# Patient Record
Sex: Female | Born: 1935 | Race: Black or African American | Hispanic: No | Smoking: Never smoker
Health system: Southern US, Community
[De-identification: ages and names within clinical notes are randomized; demographics above are authoritative.]

## PROBLEM LIST (undated history)

## (undated) DIAGNOSIS — Z950 Presence of cardiac pacemaker: Secondary | ICD-10-CM

## (undated) DIAGNOSIS — I679 Cerebrovascular disease, unspecified: Secondary | ICD-10-CM

## (undated) DIAGNOSIS — K219 Gastro-esophageal reflux disease without esophagitis: Secondary | ICD-10-CM

## (undated) DIAGNOSIS — M159 Polyosteoarthritis, unspecified: Secondary | ICD-10-CM

## (undated) DIAGNOSIS — F0150 Vascular dementia without behavioral disturbance: Secondary | ICD-10-CM

## (undated) DIAGNOSIS — I10 Essential (primary) hypertension: Secondary | ICD-10-CM

## (undated) DIAGNOSIS — E1149 Type 2 diabetes mellitus with other diabetic neurological complication: Secondary | ICD-10-CM

## (undated) HISTORY — DX: Vascular dementia, unspecified severity, without behavioral disturbance, psychotic disturbance, mood disturbance, and anxiety: F01.50

## (undated) HISTORY — DX: Type 2 diabetes mellitus with other diabetic neurological complication: E11.49

---

## 2011-04-26 ENCOUNTER — Emergency Department (HOSPITAL_COMMUNITY)
Admission: EM | Admit: 2011-04-26 | Discharge: 2011-04-26 | Disposition: A | Discharge status: Skilled Nursing Facility | Payer: Medicare Other | Attending: Emergency Medicine | Admitting: Emergency Medicine

## 2011-04-26 ENCOUNTER — Emergency Department (HOSPITAL_COMMUNITY): Payer: Medicare Other

## 2011-04-26 ENCOUNTER — Encounter (HOSPITAL_COMMUNITY): Payer: Self-pay | Admitting: Emergency Medicine

## 2011-04-26 ENCOUNTER — Encounter (HOSPITAL_COMMUNITY): Payer: Self-pay | Admitting: *Deleted

## 2011-04-26 DIAGNOSIS — R4701 Aphasia: Secondary | ICD-10-CM | POA: Insufficient documentation

## 2011-04-26 DIAGNOSIS — M1990 Unspecified osteoarthritis, unspecified site: Secondary | ICD-10-CM | POA: Insufficient documentation

## 2011-04-26 DIAGNOSIS — Z7982 Long term (current) use of aspirin: Secondary | ICD-10-CM | POA: Insufficient documentation

## 2011-04-26 DIAGNOSIS — Z79899 Other long term (current) drug therapy: Secondary | ICD-10-CM | POA: Insufficient documentation

## 2011-04-26 DIAGNOSIS — Z950 Presence of cardiac pacemaker: Secondary | ICD-10-CM | POA: Insufficient documentation

## 2011-04-26 DIAGNOSIS — E119 Type 2 diabetes mellitus without complications: Secondary | ICD-10-CM | POA: Insufficient documentation

## 2011-04-26 DIAGNOSIS — F0280 Dementia in other diseases classified elsewhere without behavioral disturbance: Secondary | ICD-10-CM | POA: Insufficient documentation

## 2011-04-26 DIAGNOSIS — Z794 Long term (current) use of insulin: Secondary | ICD-10-CM | POA: Insufficient documentation

## 2011-04-26 DIAGNOSIS — K922 Gastrointestinal hemorrhage, unspecified: Secondary | ICD-10-CM

## 2011-04-26 DIAGNOSIS — Z8673 Personal history of transient ischemic attack (TIA), and cerebral infarction without residual deficits: Secondary | ICD-10-CM | POA: Insufficient documentation

## 2011-04-26 DIAGNOSIS — K920 Hematemesis: Secondary | ICD-10-CM | POA: Insufficient documentation

## 2011-04-26 DIAGNOSIS — I10 Essential (primary) hypertension: Secondary | ICD-10-CM | POA: Insufficient documentation

## 2011-04-26 DIAGNOSIS — K219 Gastro-esophageal reflux disease without esophagitis: Secondary | ICD-10-CM | POA: Insufficient documentation

## 2011-04-26 DIAGNOSIS — G309 Alzheimer's disease, unspecified: Secondary | ICD-10-CM | POA: Insufficient documentation

## 2011-04-26 HISTORY — DX: Essential (primary) hypertension: I10

## 2011-04-26 HISTORY — DX: Presence of cardiac pacemaker: Z95.0

## 2011-04-26 HISTORY — DX: Cerebrovascular disease, unspecified: I67.9

## 2011-04-26 HISTORY — DX: Gastro-esophageal reflux disease without esophagitis: K21.9

## 2011-04-26 HISTORY — DX: Polyosteoarthritis, unspecified: M15.9

## 2011-04-26 LAB — CBC
HCT: 38.6 % (ref 36.0–46.0)
HCT: 36.6 % (ref 36.0–46.0)
MCH: 25.2 pg — ABNORMAL LOW (ref 26.0–34.0)
MCH: 26 pg (ref 26.0–34.0)
MCHC: 32.1 g/dL (ref 30.0–36.0)
MCV: 78.5 fL (ref 78.0–100.0)
MCV: 78.5 fL (ref 78.0–100.0)
Platelets: 371 10*3/uL (ref 150–400)
RDW: 16.4 % — ABNORMAL HIGH (ref 11.5–15.5)
RDW: 16.5 % — ABNORMAL HIGH (ref 11.5–15.5)

## 2011-04-26 LAB — DIFFERENTIAL
Eosinophils Absolute: 0.2 10*3/uL (ref 0.0–0.7)
Eosinophils Relative: 2 % (ref 0–5)
Lymphocytes Relative: 29 % (ref 12–46)
Lymphs Abs: 2.8 10*3/uL (ref 0.7–4.0)
Monocytes Absolute: 0.9 10*3/uL (ref 0.1–1.0)

## 2011-04-26 LAB — OCCULT BLOOD, POC DEVICE: Fecal Occult Bld: NEGATIVE

## 2011-04-26 LAB — TYPE AND SCREEN
ABO/RH(D): A POS
Antibody Screen: NEGATIVE

## 2011-04-26 LAB — COMPREHENSIVE METABOLIC PANEL
Albumin: 3.1 g/dL — ABNORMAL LOW (ref 3.5–5.2)
BUN: 15 mg/dL (ref 6–23)
Calcium: 9.1 mg/dL (ref 8.4–10.5)
Creatinine, Ser: 1.11 mg/dL — ABNORMAL HIGH (ref 0.50–1.10)
GFR calc Af Amer: 53 mL/min — ABNORMAL LOW (ref >90)
Glucose, Bld: 256 mg/dL — ABNORMAL HIGH (ref 70–99)
Total Protein: 6.9 g/dL (ref 6.0–8.3)

## 2011-04-26 LAB — PROTIME-INR
INR: 0.99 (ref 0.00–1.49)
Prothrombin Time: 13.3 seconds (ref 11.6–15.2)

## 2011-04-26 MED ORDER — PANTOPRAZOLE SODIUM 40 MG IV SOLR
40.0000 mg | Freq: Once | INTRAVENOUS | Status: AC
  Administered 2011-04-26: 40 mg via INTRAVENOUS
  Filled 2011-04-26: qty 40

## 2011-04-26 MED ORDER — ONDANSETRON HCL 4 MG/2ML IJ SOLN
4.0000 mg | Freq: Once | INTRAMUSCULAR | Status: AC
  Administered 2011-04-26: 4 mg via INTRAVENOUS
  Filled 2011-04-26: qty 2

## 2011-04-26 MED ORDER — SODIUM CHLORIDE 0.9 % IV SOLN
INTRAVENOUS | Status: DC
  Administered 2011-04-26 (×2): 125 mL/h via INTRAVENOUS

## 2011-04-26 MED ORDER — ONDANSETRON HCL 4 MG/2ML IJ SOLN
INTRAMUSCULAR | Status: AC
  Administered 2011-04-26: 4 mg via INTRAVENOUS
  Filled 2011-04-26: qty 2

## 2011-04-26 NOTE — ED Notes (Signed)
Patient from nursing home seen in ED one day ago discharged back to facility today. Staff called EMS for emesis bright red blood X1.  Patient non verbal normal for patient no distress noted by EMS or arrival to ED.  Airway intact bilateral equal chest rise and fall.

## 2011-04-26 NOTE — ED Notes (Signed)
2 family members arrived stated patient and large amount of emesis bright red 0200 today sent to ED for evaluation and was discharged back to the nursing home.  Nurse and nursing home stated sent back to ED for reevaluation to make sure.  No emesis after returning from ED.

## 2011-04-26 NOTE — ED Notes (Signed)
Pt arrived via [facility] c/o upper GI Bleed. Emesis x 2 with coffee ground appearance. From [facility]. 20 ga rt hand. 150 NS, and 4 mg Zofran administered prior to arrival. PT is non verbal.

## 2011-04-26 NOTE — ED Notes (Signed)
Pt arrived via [facility] already in gown; placed on continuous pulse oximetry and blood pressure cuff; warm blankets given

## 2011-04-26 NOTE — ED Provider Notes (Signed)
History     CSN: [id number]  Arrival date & time 04/26/11  1205   First MD Initiated Contact with Patient 04/26/11 1221      Chief Complaint  Patient presents with  . Hematemesis    (Consider location/radiation/quality/duration/timing/severity/associated sxs/prior treatment) Patient is a 78 y.o. female presenting with vomiting. The history is provided by the patient. No language interpreter was used.  Emesis  This is a new problem. The current episode started 12 to 24 hours ago. Episode frequency: 1 time. The problem has not changed since onset.The emesis has an appearance of bright red blood. There has been no fever. Pertinent negatives include no chills, no diarrhea, no fever and no sweats.   patient returning for the second time today from nursing facility with complaint of emesis bright red blood in it times one at 2 AM this morning. Patient was discharged from the ER around 5:30 after a workup for the bloody emesis and was sent home. Family is at the bedside saying that the nurse at the facility said that it was too much blood and she needs to come back to be reevaluated. Patient sitting up in bed alert looking around no distress. She does have Alzheimer's and is not verbally communicating. Family members at bedside very concerned that she's lost too much blood. I told him we will recheck her blood level but she probably needs endoscopy or colonoscopy. They were agreeable to this. .  Past Medical History  Diagnosis Date  . Osteoarthrosis, generalized, involving multiple sites   . Alzheimer disease   . Esophageal reflux   . Diabetes mellitus   . Hypertension   . Pacemaker   . Cerebrovascular disease     History reviewed. No pertinent past surgical history.  No family history on file.  History  Substance Use Topics  . Smoking status: Never Smoker   . Smokeless tobacco: Not on file  . Alcohol Use: No    OB History    Grav Para Term Preterm Abortions TAB SAB Ect Mult  Living                  Review of Systems  Unable to perform ROS Constitutional: Negative for fever and chills.  Gastrointestinal: Positive for vomiting. Negative for diarrhea.    Allergies  Review of patient's allergies indicates no known allergies.  Home Medications   Current Outpatient Rx  Name Route Sig Dispense Refill  . ASPIRIN 81 MG PO TBDP Oral Take 81 mg by mouth daily.    Marland Kitchen GLIPIZIDE 10 MG PO TABS Oral Take 10 mg by mouth 2 (two) times daily before a meal.    . INSULIN ASPART 100 UNIT/ML [place] SOLN Subcutaneous Inject 5 Units into the skin 3 (three) times daily before meals. If cbg >150    . INSULIN GLARGINE 100 UNIT/ML [place] SOLN Subcutaneous Inject 10 Units into the skin at bedtime.    . MELOXICAM 15 MG PO TABS Oral Take 15 mg by mouth daily.    Marland Kitchen MEMANTINE HCL 10 MG PO TABS Oral Take 10 mg by mouth daily.     Marland Kitchen OMEPRAZOLE 20 MG PO CPDR Oral Take 20 mg by mouth daily before breakfast.    . ACETAMINOPHEN 325 MG PO TABS Oral Take 650 mg by mouth every 4 (four) hours as needed. For pain or fever    . PROMETHAZINE HCL 25 MG PO TABS Oral Take 25 mg by mouth every 8 (eight) hours as needed. nausea  BP 131/64  Pulse 75  Temp(Src) 98.6 F (37 C) (Oral)  Resp 15  SpO2 97%  Physical Exam  Nursing note and vitals reviewed. Constitutional: She appears well-developed and well-nourished.  HENT:  Head: Normocephalic.  Eyes: Conjunctivae and EOM are normal. Pupils are equal, round, and reactive to light.  Neck: Normal range of motion. Neck supple.  Cardiovascular: Normal rate.   Pulmonary/Chest: Effort normal.  Abdominal: Soft. Bowel sounds are normal. She exhibits no distension. There is no tenderness. There is no rebound and no guarding.  Musculoskeletal: Normal range of motion.  Neurological: She is alert.       Dementia no verbal communication  Skin: Skin is warm and dry.  Psychiatric:       No verbal alert focusing and following.    ED Course  Procedures  (including critical care time)   Labs Reviewed  CBC  DIFFERENTIAL  URINALYSIS, ROUTINE W REFLEX MICROSCOPIC   Dg Chest Portable 1 View  04/26/2011  *RADIOLOGY REPORT*  Clinical Data: GI bleed.  Shortness of breath.  PORTABLE CHEST - 1 VIEW  Comparison: None.  Findings: Dual lead cardiac pacemaker.  Shallow inspiration. Cardiac enlargement with mild prominence of pulmonary vascularity. Hazy appearance of the hila may be due to soft tissue attenuation versus early perihilar edema.  No apparent blunting of costophrenic angles.  No focal airspace consolidation.  No pneumothorax. Calcification of the aorta.  IMPRESSION: Cardiac enlargement with mild pulmonary vascular congestion and suggestion of early pulmonary edema.  Original Report Authenticated By: Marlon Pel, M.D.     No diagnosis found.    MDM  Here for the second time today for gi bleed.  Vomited x 1 around 2am.  Was seen by dr Dierdre Highman and sent back to nursing facility.  Hgb stable again on CBC today.  12.3 this am and 12.1 presently.  Given a gi follow up and continue omeprazole 20mg .  Call tday for appointment.  - occult stool x 2.  Labs Reviewed  CBC - Abnormal; Notable for the following:    RDW 16.5 (*)    All other components within normal limits  DIFFERENTIAL  OCCULT BLOOD, POC DEVICE          Remi Haggard, NP 04/26/11 1655  Remi Haggard, NP 04/26/11 1655

## 2011-04-26 NOTE — ED Notes (Signed)
Patient arrived to ED by EMS no distress present airway intact bilateral equal chest rise and fall lungs clear in all fields.  Patient non verbal normal per nursing home staff.  Abdomen soft distended bowel sounds present in all fields.

## 2011-04-26 NOTE — ED Provider Notes (Signed)
History     CSN: [id number]  Arrival date & time 04/26/11  4098   First MD Initiated Contact with Patient 04/26/11 0330      Chief Complaint  Patient presents with  . GI Bleeding    (Consider location/radiation/quality/duration/timing/severity/associated sxs/prior treatment) HPI History provided by EMS and nursing home report and personnel. Level V caveat applies for baseline aphasia and unable to provide history. Brought in by EMS tonight for vomiting followed by coffee-ground emesis while at the nursing home. No bloody stools or black or tarry stools noted. No apparent, pain or symptoms otherwise. Per nursing home report has no history of same, is on Prilosec and Mobic, Tylenol and aspirin. No known history of GI bleed or ulcers. Patient is able to shake her head yes and no enhancement limited yes no questions. She denies any pain any denies any nausea after receiving Zofran in route. No past medical history on file.  No past surgical history on file.  No family history on file.  History  Substance Use Topics  . Smoking status: Not on file  . Smokeless tobacco: Not on file  . Alcohol Use: Not on file    OB History    No data available      Review of Systems  Unable to perform ROS  Level V caveat applies as above Allergies  Review of patient's allergies indicates no known allergies.  Home Medications   Current Outpatient Rx  Name Route Sig Dispense Refill  . ACETAMINOPHEN 325 MG PO TABS Oral Take by mouth every 6 (six) hours as needed. For pain or fever    . ASPIRIN 81 MG PO TBDP Oral Take 81 mg by mouth daily.    Marland Kitchen GLIPIZIDE 10 MG PO TABS Oral Take 10 mg by mouth 2 (two) times daily before a meal.    . INSULIN ASPART 100 UNIT/ML [place] SOLN Subcutaneous Inject 5 Units into the skin 3 (three) times daily before meals. If cbg >150    . INSULIN GLARGINE 100 UNIT/ML [place] SOLN Subcutaneous Inject 10 Units into the skin at bedtime.    . MELOXICAM 15 MG PO TABS Oral Take 15 mg  by mouth daily.    Marland Kitchen MEMANTINE HCL 10 MG PO TABS Oral Take 10 mg by mouth 2 (two) times daily.    Marland Kitchen OMEPRAZOLE 20 MG PO CPDR Oral Take 20 mg by mouth daily before breakfast.    . PROMETHAZINE HCL 25 MG PO TABS Oral Take 25 mg by mouth every 8 (eight) hours as needed. nausea      BP 151/60  Temp(Src) 98.1 F (36.7 C) (Oral)  Resp 20  SpO2 98%  Physical Exam  Constitutional: She appears well-developed and well-nourished.  HENT:  Head: Normocephalic and atraumatic.  Eyes: Conjunctivae and EOM are normal. Pupils are equal, round, and reactive to light.  Neck: Trachea normal. Neck supple. No thyromegaly present.  Cardiovascular: Normal rate, regular rhythm, S1 normal, S2 normal and normal pulses.     No systolic murmur is present   No diastolic murmur is present  Pulses:      Radial pulses are 2+ on the right side, and 2+ on the left side.  Pulmonary/Chest: Effort normal and breath sounds normal. She has no wheezes. She has no rhonchi. She has no rales. She exhibits no tenderness.  Abdominal: Soft. Normal appearance and bowel sounds are normal. There is no tenderness. There is no CVA tenderness and negative Murphy's sign.  Genitourinary:  Guaiac negative brown stool. No masses  Musculoskeletal:       BLE:s Calves nontender, no cords or erythema, negative Homans sign  Neurological: She is alert. She has normal strength. No sensory deficit. GCS eye subscore is 4. GCS verbal subscore is 5. GCS motor subscore is 6.       No unilateral deficits. Moves all extremities x4.  Skin: Skin is warm and dry. No rash noted. She is not diaphoretic.  Psychiatric: Her speech is normal.       Cooperative and appropriate    ED Course  Procedures (including critical care time)  Labs Reviewed  CBC - Abnormal; Notable for the following:    WBC 10.8 (*)    MCH 25.2 (*)    RDW 16.4 (*)    All other components within normal limits  COMPREHENSIVE METABOLIC PANEL - Abnormal; Notable for the  following:    Glucose, Bld 256 (*)    Creatinine, Ser 1.11 (*)    Albumin 3.1 (*)    Alkaline Phosphatase 134 (*)    GFR calc non Af Amer 45 (*)    GFR calc Af Amer 53 (*)    All other components within normal limits  TYPE AND SCREEN  PROTIME-INR  OCCULT BLOOD, POC DEVICE  ABO/RH   Dg Chest Portable 1 View  04/26/2011  *RADIOLOGY REPORT*  Clinical Data: GI bleed.  Shortness of breath.  PORTABLE CHEST - 1 VIEW  Comparison: None.  Findings: Dual lead cardiac pacemaker.  Shallow inspiration. Cardiac enlargement with mild prominence of pulmonary vascularity. Hazy appearance of the hila may be due to soft tissue attenuation versus early perihilar edema.  No apparent blunting of costophrenic angles.  No focal airspace consolidation.  No pneumothorax. Calcification of the aorta.  IMPRESSION: Cardiac enlargement with mild pulmonary vascular congestion and suggestion of early pulmonary edema.  Original Report Authenticated By: Marlon Pel, M.D.      MDM   Coffee-ground emesis x2 at nursing facility  Serial evaluations in the ED with no abdominal tenderness. No anemia. Imaging and labs reviewed as above. No fever or clinical infection. No emesis in the ED. Guaiac negative stool. Recheck at 5:15 remains unchanged - plan discharge back to facility continue Phenergan as needed.  GI referral provided for further evaluation.        Sunnie Nielsen, MD 04/26/11 [phone]

## 2011-04-26 NOTE — Discharge Instructions (Signed)
Brenda Santos your blood work is unchanged since you were here this morning. The hemoglobin is stable at 12.1. Vital signs are stable and heart rate is also stable. You may need endoscopy or colonoscopy . We have referred you to a GI specialist to do this procedure we do not do it in the ER. Call today for an appointment. Also note when we checked her bottom Hemoccult negative. No further vomiting of blood in the ER today. Continue prilosec  daily.  Gastrointestinal Bleeding Bleeding in the gastrointestinal tract comes out when you throw up (vomit) or poop. Treatment will depend on how fast the blood is flowing, where it is coming from, and the cause. A small amount of bleeding that stops on its own may not need treatment.  HOME CARE  Do not drink alcohol.   Do not eat things that upset your stomach or give you heartburn.   Rest and limit your activity.   Do not smoke. Smoking may make your problems worse.   Wash your hands or use sanitizer every time you use the bathroom. Some bleeding is caused by germs.   Only take medicine as told by your doctor.  GET HELP RIGHT AWAY IF:   Your throw up looks like coffee grounds or is dark or bright red.   Your poop is black or tarry. You see blood in the toilet.   You feel weak, dizzy, and short of breath.   You breathe fast and have a fast heartbeat.   You have bad stomach pain or cramping.  MAKE SURE YOU:   Understand these instructions.   Will watch your condition.   Will get help right away if you are not doing well or get worse.  Document Released: 09/27/2007 Document Revised: 12/07/2010 Document Reviewed: 11/27/2010 [facility] Patient Information 2012 [place], [place].Gastrointestinal Bleeding Bleeding in the gastrointestinal tract comes out when you throw up (vomit) or poop. Treatment will depend on how fast the blood is flowing, where it is coming from, and the cause. A small amount of bleeding that stops on its own may not need treatment.    HOME CARE  Do not drink alcohol.   Do not eat things that upset your stomach or give you heartburn.   Rest and limit your activity.   Do not smoke. Smoking may make your problems worse.   Wash your hands or use sanitizer every time you use the bathroom. Some bleeding is caused by germs.   Only take medicine as told by your doctor.  GET HELP RIGHT AWAY IF:   Your throw up looks like coffee grounds or is dark or bright red.   Your poop is black or tarry. You see blood in the toilet.   You feel weak, dizzy, and short of breath.   You breathe fast and have a fast heartbeat.   You have bad stomach pain or cramping.  MAKE SURE YOU:   Understand these instructions.   Will watch your condition.   Will get help right away if you are not doing well or get worse.  Document Released: 09/27/2007 Document Revised: 12/07/2010 Document Reviewed: 11/27/2010 [facility] Patient Information 2012 [place], [place].Fecal Occult Blood Test This is a test done on a stool specimen to screen for gastrointestinal bleeding, which may be an indicator of colon cancer Is is usually done as part of a routine examination, annually, after age 54 or as directed by your caregiver. The fecal occult blood test (FOBT) checks for blood in your stool.  Normally, there will not be enough blood lost through the gastrointestinal tract to turn an FOBT positive or for you to notice it visually in the form of bloody or dark, tarry stools. Any significant amount of blood being passed should be investigated.  A positive FOBT will tell your caregiver that you have bleeding occurring somewhere in your gastrointestinal tract. This blood loss could be due to ulcers, diverticulosis, bleeding polyps, inflammatory bowel disease, hemorrhoids, from swallowed blood due to bleeding gums or nosebleeds, or it could be due to benign or cancerous tumors. Anything that protrudes into the lumen (the empty space in the intestine), like a polyp or  tumor, and is rubbed against by the fecal waste as it passes through has the potential to eventually bleed intermittently. Often this small amount of blood is the first, and sometimes the only, symptom of early colon cancer, making the FOBT a valuable screening tool. PREPARATION FOR TEST  You should not eat red meat within three days before testing. Other substances that could cause a false positive test result include fish, turnips, horseradish, and drugs such as colchicines and oxidizing drugs (for example, iodine and boric acid). Be sure to carefully follow your caregiver's instructions. With FOBT, your caregiver or laboratory will give you one or more test "cards." You collect a separate sample from three different stools, usually on consecutive days. Each stool sample should be collected into a clean container and should not be contaminated with urine or water. The slide is labeled with your name and the date; then, with an applicator stick, you apply a thin smear of stool onto each filter paper square/window contained on the card. Allow the filter paper to dry. Once it is dry, it is stable. Usually you will collect all of the consecutive samples, and then return all of them to your caregiver or laboratory at the same time, sometimes by mailing them. There are also over the counter tests which are dropped in your toilet. NORMAL FINDINGS   No occult blood within the stool.   The FOBT test is normally negative. A positive indicates either blood in the stool or an interfering substance. Multiple samples are done to: 1) catch intermittent bleeding; and 2) help rule out false positives.  Ranges for normal findings may vary among different laboratories and hospitals. You should always check with your doctor after having lab work or other tests done to discuss the meaning of your test results and whether your values are considered within normal limits. MEANING OF TEST  Your caregiver will go over the test  results with you and discuss the importance and meaning of your results, as well as treatment options and the need for additional tests if necessary. OBTAINING THE TEST RESULTS  It is your responsibility to obtain your test results. Ask the lab or department performing the test when and how you will get your results. Document Released: 01/13/2004 Document Revised: 12/07/2010 Document Reviewed: 11/28/2007 [facility] Patient Information 2012 [place], [place].

## 2011-04-26 NOTE — Discharge Instructions (Signed)
Hematemesis This condition is the vomiting of blood. CAUSES  This can happen if you have a peptic ulcer or an irritation of the throat, stomach, or small bowel. Vomiting over and over again or swallowing blood from a nosebleed, coughing or facial injury can also result in bloody vomit. Anti-inflammatory pain medicines are a common cause of this potentially dangerous condition. The most serious causes of vomiting blood include:  Ulcers (a bacteria called H. pylori is common cause of ulcers).   Clotting problems.   Alcoholism.   Cirrhosis.  TREATMENT  Treatment depends on the cause and the severity of the bleeding. Small amounts of blood streaks in the vomit is not the same as vomiting large amounts of bloody or dark, coffee grounds-like material. Weakness, fainting, dehydration, anemia, and continued alcohol or drug use increase the risk. Examination may include blood, vomit, or stool tests. The presence of bloody or dark stool that tests positive for blood (Hemoccult) means the bleeding has been going on for some time. Endoscopy and imaging studies may be done. Emergency treatment may include:  IV medicines or fluids.   Blood transfusions.   Surgery.  Hospital care is required for high risk patients or when IV fluids or blood is needed. Upper GI bleeding can cause shock and death if not controlled. HOME CARE INSTRUCTIONS   Your treatment does not require hospital care at this time.   Remain at rest until your condition improves.   Drink clear liquids as tolerated.   Avoid:   Alcohol.   Nicotine.   Aspirin.   Any other anti-inflammatory medicine (ibuprofen, naproxen, and many others).   Medications to suppress stomach acid or vomiting may be needed. Take all your medicine as prescribed.   Be sure to see your caregiver for follow-up as recommended.  SEEK IMMEDIATE MEDICAL CARE IF:   You have repeated vomiting, dehydration, fainting, or extreme weakness.   You are vomiting  large amounts of bloody or dark material.   You pass large, dark or bloody stools.

## 2011-04-26 NOTE — ED Notes (Signed)
Called [facility] for transport.  

## 2011-04-27 ENCOUNTER — Telehealth: Payer: Self-pay | Admitting: Gastroenterology

## 2011-04-27 NOTE — Telephone Encounter (Signed)
Pt is a Dr Brenda Santos pt and has an appt on Monday

## 2011-05-01 NOTE — ED Provider Notes (Signed)
Medical screening examination/treatment/procedure(s) were performed by non-physician practitioner and as supervising physician I was immediately available for consultation/collaboration.  Raeford Razor, MD 05/01/11 1247

## 2012-04-29 ENCOUNTER — Non-Acute Institutional Stay (SKILLED_NURSING_FACILITY): Payer: Medicare Other | Admitting: Adult Health

## 2012-04-29 ENCOUNTER — Encounter: Payer: Self-pay | Admitting: Adult Health

## 2012-04-29 DIAGNOSIS — K219 Gastro-esophageal reflux disease without esophagitis: Secondary | ICD-10-CM

## 2012-04-29 DIAGNOSIS — F0150 Vascular dementia without behavioral disturbance: Secondary | ICD-10-CM

## 2012-04-29 DIAGNOSIS — E1149 Type 2 diabetes mellitus with other diabetic neurological complication: Secondary | ICD-10-CM

## 2012-06-17 ENCOUNTER — Non-Acute Institutional Stay (SKILLED_NURSING_FACILITY): Payer: Medicare Other | Admitting: Adult Health

## 2012-06-17 DIAGNOSIS — F0150 Vascular dementia without behavioral disturbance: Secondary | ICD-10-CM

## 2012-06-17 DIAGNOSIS — E1149 Type 2 diabetes mellitus with other diabetic neurological complication: Secondary | ICD-10-CM

## 2012-06-17 DIAGNOSIS — K219 Gastro-esophageal reflux disease without esophagitis: Secondary | ICD-10-CM

## 2012-06-17 DIAGNOSIS — I10 Essential (primary) hypertension: Secondary | ICD-10-CM

## 2012-08-27 ENCOUNTER — Non-Acute Institutional Stay (SKILLED_NURSING_FACILITY): Payer: Medicare Other | Admitting: Internal Medicine

## 2012-08-27 DIAGNOSIS — M159 Polyosteoarthritis, unspecified: Secondary | ICD-10-CM

## 2012-08-27 DIAGNOSIS — E1149 Type 2 diabetes mellitus with other diabetic neurological complication: Secondary | ICD-10-CM

## 2012-08-27 DIAGNOSIS — I10 Essential (primary) hypertension: Secondary | ICD-10-CM

## 2012-08-27 DIAGNOSIS — F0150 Vascular dementia without behavioral disturbance: Secondary | ICD-10-CM

## 2012-08-27 DIAGNOSIS — Z950 Presence of cardiac pacemaker: Secondary | ICD-10-CM

## 2012-08-27 DIAGNOSIS — K219 Gastro-esophageal reflux disease without esophagitis: Secondary | ICD-10-CM

## 2012-08-27 DIAGNOSIS — E1142 Type 2 diabetes mellitus with diabetic polyneuropathy: Secondary | ICD-10-CM

## 2012-08-27 NOTE — Progress Notes (Signed)
Patient ID: Brenda Santos, female   DOB: Dec 04, 1927, 79 y.o.   MRN: [id number] Location:  Location:  [name] Provider:  Gwenith Spitz. Renato Gails, D.O., C.M.D.  Code Status:  Full code   Chief Complaint  Patient presents with  . Medical Managment of Chronic Issues    HPI:  79 yo female with h/o dementia, diabetes mellitus II, obesity, osteoarthritis and GERD was seen for a regular visit to manage her chronic illnesses.  She was seated in her wheelchair in her room.  Staff tell me she is actually ambulatory and will walk up and down the halls at times.  She does speak, but did not speak to me at all during the visit (unclear why).  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    Staff have not noted any hypoglycemia with her.  She eats very well.    Review of Systems:  Review of Systems  Unable to perform ROS: psychiatric disorder    Medications: Patient's Medications  New Prescriptions   No medications on file  Previous Medications   ACETAMINOPHEN (TYLENOL) 325 MG TABLET    Take 650 mg by mouth every 4 (four) hours as needed. For pain or fever   ASPIRIN 81 MG EC TABLET    Take 81 mg by mouth daily.   CALCIUM CARBONATE-VITAMIN D (CALTRATE 600+D) 600-400 MG-UNIT PER TABLET    Take 1 tablet by mouth 2 (two) times daily.   GLIPIZIDE (GLUCOTROL) 10 MG TABLET    Take 10 mg by mouth daily.    INSULIN ASPART (NOVOLOG) 100 UNIT/ML INJECTION    Inject 5 Units into the skin 3 (three) times daily before meals. If cbg >150   INSULIN GLARGINE (LANTUS) 100 UNIT/ML INJECTION    Inject 10 Units into the skin at bedtime.   MEMANTINE HCL ER (NAMENDA XR) 28 MG CP24    Take 28 mg by mouth daily.   MULTIPLE VITAMINS-MINERALS (DECUBI-VITE) CAPS    Take 2 capsules by mouth daily.   OMEPRAZOLE (PRILOSEC) 20 MG CAPSULE    Take 20 mg by mouth daily before breakfast.  Modified Medications   No medications on file  Discontinued Medications   MELOXICAM (MOBIC) 15 MG TABLET     Take 15 mg by mouth daily.   MEMANTINE (NAMENDA) 10 MG TABLET    Take 10 mg by mouth daily.    PROMETHAZINE (PHENERGAN) 25 MG TABLET    Take 25 mg by mouth every 8 (eight) hours as needed. nausea    Physical Exam: Filed Vitals:   08/29/12 0820  BP: 127/66  Pulse: 68  Temp: 97.4 F (36.3 C)  Resp: 18  SpO2: 95%   Physical Exam  Constitutional: She is oriented to person, place, and time. She appears well-developed and well-nourished. No distress.  Obese black female, nad  HENT:  Head: Normocephalic and atraumatic.  Right Ear: External ear normal.  Left Ear: External ear normal.  Nose: Nose normal.  Mouth/Throat: Oropharynx is clear and moist.  Eyes: EOM are normal. Pupils are equal, round, and reactive to light.  Neck: Normal range of motion.  Cardiovascular: Normal rate, regular rhythm, normal heart sounds and intact distal pulses.   Pulmonary/Chest: Effort normal and breath sounds normal. No respiratory distress.  Abdominal: Soft. Bowel sounds are normal. She exhibits no distension and no mass. There is no tenderness.  Musculoskeletal: Normal range of motion.  Tenderness of knees present  Neurological: She is alert and  oriented to person, place, and time. No cranial nerve deficit.  Skin: Skin is warm and dry. No rash noted.  Psychiatric:  Flat affect, just stares at me and will not speak   Labs reviewed: 04/30/12:  H/h 10.9/33.8, wbc 8.4, plts 266, Na 141, K 4.2, BUN 22, cr 1.17, alb 3.1, hba1c 8.3  Assessment/Plan 1. Vascular dementia -has unusual affect--would not speak to me today -on namenda alone at this point -is ambulatory and able to do some ADLs on her own -cognition was difficult to assess when she would not speak to me 2. DM (diabetes mellitus) type II controlled, neurological manifestation -on glipizide, lantus and novolog meal coverage -cont to monitor, check hba1c next draw  3. Esophageal reflux -stable, no recent complaints, cont current therapy  4.  Osteoarthrosis, generalized, involving multiple sites -primarily of knees -stable, no complaints today  5. Hypertension -at goal with current therapy  6. Pacemaker -In place, no complications--need to get information on who was following her for this and when it was last interrogated  Family/ staff Communication: discussed with her nurse  Goals of care: full code Labs/tests ordered:  Hba1c, bmp and urine microalbumin next draw (not on acei)

## 2012-08-29 ENCOUNTER — Encounter: Payer: Self-pay | Admitting: Internal Medicine

## 2012-08-29 DIAGNOSIS — M159 Polyosteoarthritis, unspecified: Secondary | ICD-10-CM | POA: Insufficient documentation

## 2012-08-29 DIAGNOSIS — F0150 Vascular dementia without behavioral disturbance: Secondary | ICD-10-CM | POA: Insufficient documentation

## 2012-08-29 DIAGNOSIS — I10 Essential (primary) hypertension: Secondary | ICD-10-CM | POA: Insufficient documentation

## 2012-08-29 DIAGNOSIS — K219 Gastro-esophageal reflux disease without esophagitis: Secondary | ICD-10-CM | POA: Insufficient documentation

## 2012-08-29 DIAGNOSIS — Z950 Presence of cardiac pacemaker: Secondary | ICD-10-CM | POA: Insufficient documentation

## 2012-08-29 DIAGNOSIS — E1149 Type 2 diabetes mellitus with other diabetic neurological complication: Secondary | ICD-10-CM | POA: Insufficient documentation

## 2012-09-26 ENCOUNTER — Non-Acute Institutional Stay (SKILLED_NURSING_FACILITY): Payer: Medicare Other | Admitting: Nurse Practitioner

## 2012-09-26 DIAGNOSIS — F0150 Vascular dementia without behavioral disturbance: Secondary | ICD-10-CM

## 2012-09-26 DIAGNOSIS — E1142 Type 2 diabetes mellitus with diabetic polyneuropathy: Secondary | ICD-10-CM

## 2012-09-26 DIAGNOSIS — E1149 Type 2 diabetes mellitus with other diabetic neurological complication: Secondary | ICD-10-CM

## 2012-09-26 DIAGNOSIS — M159 Polyosteoarthritis, unspecified: Secondary | ICD-10-CM

## 2012-09-26 DIAGNOSIS — I10 Essential (primary) hypertension: Secondary | ICD-10-CM

## 2012-09-26 DIAGNOSIS — D509 Iron deficiency anemia, unspecified: Secondary | ICD-10-CM

## 2012-09-26 NOTE — Progress Notes (Signed)
Patient ID: Brenda Santos, female   DOB: 01-Jan-1928, 79 y.o.   MRN: [id number]   PCP: Bufford Spikes, DO   No Known Allergies  Chief Complaint  Patient presents with  . Medical Managment of Chronic Issues    HPI:  79 year old who is a LTR of [facility] with pmh of advanced dementia, GERD, HTN, DM is being seen today for routine follow up. Pt unable to participate in ROS or HPI, nursing does not have any concerns at this time.   Review of Systems:  Unable to obtain   Past Medical History  Diagnosis Date  . Osteoarthrosis, generalized, involving multiple sites   . Vascular dementia   . Esophageal reflux   . DM (diabetes mellitus) type II controlled, neurological manifestation   . Hypertension   . Pacemaker   . Cerebrovascular disease    No past surgical history on file. Social History:   reports that she has never smoked. She does not have any smokeless tobacco history on file. She reports that she does not drink alcohol or use illicit drugs.  No family history on file.  Medications: Patient's Medications  New Prescriptions   No medications on file  Previous Medications   ACETAMINOPHEN (TYLENOL) 325 MG TABLET    Take 650 mg by mouth every 4 (four) hours as needed. For pain or fever   ASPIRIN 81 MG EC TABLET    Take 81 mg by mouth daily.   CALCIUM CARBONATE-VITAMIN D (CALTRATE 600+D) 600-400 MG-UNIT PER TABLET    Take 1 tablet by mouth 2 (two) times daily.   GLIPIZIDE (GLUCOTROL) 10 MG TABLET    Take 10 mg by mouth daily.    INSULIN ASPART (NOVOLOG) 100 UNIT/ML INJECTION    Inject 5 Units into the skin 3 (three) times daily before meals. If cbg >150   INSULIN GLARGINE (LANTUS) 100 UNIT/ML INJECTION    Inject 10 Units into the skin at bedtime.   MEMANTINE HCL ER (NAMENDA XR) 28 MG CP24    Take 28 mg by mouth daily.   MULTIPLE VITAMINS-MINERALS (DECUBI-VITE) CAPS    Take 2 capsules by mouth daily.   OMEPRAZOLE (PRILOSEC) 20 MG CAPSULE    Take 20 mg by mouth daily before  breakfast.  Modified Medications   No medications on file  Discontinued Medications   No medications on file     Physical Exam:  Filed Vitals:   09/26/12 1738  BP: 140/89  Pulse: 88  Resp: 20   Physical Exam  Constitutional: She is well-developed, well-nourished, and in no distress. No distress.  HENT:  Head: Normocephalic and atraumatic.  Mouth/Throat: Oropharynx is clear and moist. No oropharyngeal exudate.  Eyes: Conjunctivae and EOM are normal. Pupils are equal, round, and reactive to light.  Neck: Normal range of motion. Neck supple.  Cardiovascular: Normal rate, regular rhythm and normal heart sounds.   Pulmonary/Chest: Effort normal and breath sounds normal. No respiratory distress.  Abdominal: Soft. Bowel sounds are normal. She exhibits no distension.  Musculoskeletal: She exhibits no edema and no tenderness.  Self propels with WC  Neurological: She is alert.  Skin: Skin is warm and dry. She is not diaphoretic.      Labs reviewed: 04/30/2012: wbc 8.4, rbc 4.43, hgb 10.9, hct 33.8 Sodium 141, potassium 4.2, glucose 144, BUN 22, Cr 1.17 A1c8.3    Assessment/Plan 1. Vascular dementia without behavioral disturbance Stable in current enviroment  2. DM (diabetes mellitus) type II controlled, neurological manifestation Will follow up  A1c  3. Hypertension Stable on current medications  4. Osteoarthrosis, generalized, involving multiple sites Stable; no signs of increased pain  5. Microcytic anemia Will follow up cbc and iron panel

## 2012-09-28 DIAGNOSIS — D509 Iron deficiency anemia, unspecified: Secondary | ICD-10-CM | POA: Insufficient documentation

## 2012-11-03 ENCOUNTER — Non-Acute Institutional Stay (SKILLED_NURSING_FACILITY): Payer: Medicare Other | Admitting: Nurse Practitioner

## 2012-11-03 DIAGNOSIS — M159 Polyosteoarthritis, unspecified: Secondary | ICD-10-CM

## 2012-11-03 DIAGNOSIS — I10 Essential (primary) hypertension: Secondary | ICD-10-CM

## 2012-11-03 DIAGNOSIS — K219 Gastro-esophageal reflux disease without esophagitis: Secondary | ICD-10-CM

## 2012-11-03 DIAGNOSIS — E1149 Type 2 diabetes mellitus with other diabetic neurological complication: Secondary | ICD-10-CM

## 2012-11-03 DIAGNOSIS — D509 Iron deficiency anemia, unspecified: Secondary | ICD-10-CM

## 2012-11-03 DIAGNOSIS — F0150 Vascular dementia without behavioral disturbance: Secondary | ICD-10-CM

## 2012-11-03 NOTE — Progress Notes (Signed)
Patient ID: Brenda Santos, female   DOB: 1927-12-29, 79 y.o.   MRN: [id number] Nursing Home Location:  [facility]   Place of Service: SNF (31)  PCP: REED, TIFFANY, DO No Known Allergies  Chief Complaint  Patient presents with  . Medical Managment of Chronic Issues    HPI:  79 year old who is a LTR of [facility] with pmh of advanced dementia, GERD, HTN, DM is being seen today for routine follow up. Pt unable to participate in ROS or HPI, nursing does not have any concerns at this time.   Review of Systems:  Review of Systems  Unable to perform ROS: dementia     Past Medical History  Diagnosis Date  . Osteoarthrosis, generalized, involving multiple sites   . Vascular dementia   . Esophageal reflux   . DM (diabetes mellitus) type II controlled, neurological manifestation   . Hypertension   . Pacemaker   . Cerebrovascular disease    No past surgical history on file. Social History:   reports that she has never smoked. She does not have any smokeless tobacco history on file. She reports that she does not drink alcohol or use illicit drugs.  No family history on file.  Medications: Patient's Medications  New Prescriptions   No medications on file  Previous Medications   ACETAMINOPHEN (TYLENOL) 325 MG TABLET    Take 650 mg by mouth every 4 (four) hours as needed. For pain or fever   ASPIRIN 81 MG EC TABLET    Take 81 mg by mouth daily.   CALCIUM CARBONATE-VITAMIN D (CALTRATE 600+D) 600-400 MG-UNIT PER TABLET    Take 1 tablet by mouth 2 (two) times daily.   GLIPIZIDE (GLUCOTROL) 10 MG TABLET    Take 10 mg by mouth daily.    INSULIN ASPART (NOVOLOG) 100 UNIT/ML INJECTION    Inject 5 Units into the skin 3 (three) times daily before meals. If cbg >150   INSULIN GLARGINE (LANTUS) 100 UNIT/ML INJECTION    Inject 10 Units into the skin at bedtime.   MEMANTINE HCL ER (NAMENDA XR) 28 MG CP24    Take 28 mg by mouth daily.   MULTIPLE VITAMINS-MINERALS (DECUBI-VITE)  CAPS    Take 2 capsules by mouth daily.   OMEPRAZOLE (PRILOSEC) 20 MG CAPSULE    Take 20 mg by mouth daily before breakfast.  Modified Medications   No medications on file  Discontinued Medications   No medications on file     Physical Exam:  Filed Vitals:   11/03/12 1232  BP: 132/91  Pulse: 91  Temp: 97.7 F (36.5 C)  Resp: 20   Physical Exam  Constitutional: She is well-developed, well-nourished, and in no distress. HEENT: unremarkable Cardiovascular: Normal rate, regular rhythm and normal heart sounds.  Pulmonary/Chest: Effort normal and breath sounds normal. No respiratory distress.  Abdominal: Soft. Bowel sounds are normal. She exhibits no distension.  Musculoskeletal: She exhibits no edema and no tenderness.  Self propels with WC  Neurological: She is alert. Nonverbal due to advanced dementia  Skin: Skin is warm and dry. She is not diaphoretic.     Labs reviewed: 04/30/2012: wbc 8.4, rbc 4.43, hgb 10.9, hct 33.8  Sodium 141, potassium 4.2, glucose 144, BUN 22, Cr 1.17  A1c8.3 Iron and IBC    Result: 09/29/2012 11:18 AM   ( Status: F )     C Iron 67     42-145 ug/dL SLN   UIBC 782  125-400 ug/dL SLN   TIBC 161     096-045 ug/dL SLN   %SAT 19   L 40-98 % SLN   CBC NO Diff (Complete Blood Count)    Result: 09/29/2012 12:03 PM   ( Status: F )       WBC 6.6     4.0-10.5 K/uL SLN   RBC 4.75     3.87-5.11 MIL/uL SLN   Hemoglobin 11.8   L 12.0-15.0 g/dL SLN   Hematocrit 11.9     36.0-46.0 % SLN   MCV 77.1   L 78.0-100.0 fL SLN   MCH 24.8   L 26.0-34.0 pg SLN   MCHC 32.2     30.0-36.0 g/dL SLN   RDW 14.7   H 82.9-56.2 % SLN   Platelet Count 267     150-400 K/uL SLN   Basic Metabolic Panel    Result: 09/29/2012 11:18 AM   ( Status: F )       Sodium 138     135-145 mEq/L SLN   Potassium 4.3     3.5-5.3 mEq/L SLN   Chloride 107     96-112 mEq/L SLN   CO2 24     19-32 mEq/L SLN   Glucose 98     70-99 mg/dL SLN   BUN 15     1-30 mg/dL SLN   Creatinine 8.65    H 0.50-1.10 mg/dL SLN   Calcium 9.0     7.8-46.9 mg/dL SLN   Ferritin    Result: 09/29/2012 11:13 AM   ( Status: F )       Ferritin 28     10-291 ng/mL SLN   Hemoglobin A1C    Result: 09/29/2012 1:38 PM   ( Status: F )       Hemoglobin A1C 8.1   H <5.7 % SLN C Estimated Average Glucose 186   H <117 mg/dL S  Assessment/Plan 1. DM (diabetes mellitus) type II controlled, neurological manifestation Blood sugars and A1c are worse; family has signed a release that pt can eat whatever she would like;  will increase lantus to 14 units at bedtime; cont glipizide and novolog SSI  2. Esophageal reflux Stable; no signs of reflux ; will cont prilosec at this time  3. Hypertension Patient is stable; continue current regimen. Will monitor and make changes as necessary.  4. Vascular dementia without behavioral disturbance Advanced; remains stable in current living environment; will cont current medications   5. Osteoarthrosis, generalized, involving multiple sites No signs of worsening pain; will cont current medication  6. Microcytic anemia Patient is stable; continue current regimen. Will monitor and make changes as necessary.

## 2012-11-05 NOTE — Progress Notes (Signed)
Patient ID: Brenda Santos, female   DOB: 25-Dec-1927, 79 y.o.   MRN: [id number]  [facility]  No Known Allergies  Chief Complaint  Patient presents with  . Medical Managment of Chronic Issues    HPI  She is being seen for the management of chronic illnesses. There are no concerns being voiced by the nursing staff at this time. She is taking remeron 7.5 mg nightly for her appetite her current weight is 219 pounds; will need to stop this medication.  Overall her status remains without significant change.   Past Medical History  Diagnosis Date  . Osteoarthrosis, generalized, involving multiple sites   . Vascular dementia   . Esophageal reflux   . DM (diabetes mellitus) type II controlled, neurological manifestation   . Hypertension   . Pacemaker   . Cerebrovascular disease     No past surgical history on file.  Filed Vitals:   04/29/12 2345  BP: 117/67  Pulse: 70  Height: 5\' 8"  (1.727 m)  Weight: 219 lb (99.338 kg)    MEDICATIONS  Asa 81 mg daily Ca++600/400 twice daily Glipizide 10 mg twice daily lantus 10 units daily namenda 10 mg twice daily novolog 5 units prior to meals for cbg >=150 prilosec 20 mg twice daily remeron 7.5 mg nightly   Review of Systems  Unable to perform ROS   Physical Exam  Constitutional: She appears well-developed and well-nourished. No distress.  obese  Neck: Neck supple. No JVD present.  Cardiovascular: Normal rate, regular rhythm and intact distal pulses.   Respiratory: Effort normal and breath sounds normal. No respiratory distress. She has no wheezes.  GI: Soft. Bowel sounds are normal. She exhibits no distension. There is no tenderness.  Musculoskeletal: Normal range of motion. She exhibits no edema.  Neurological: She is alert.  Skin: Skin is warm and dry. She is not diaphoretic.    ASSESSMENT/PLAN  1. Gerd: will continue her prilosec 20 mg twice daily and will monitor  2. Diabetes: will continue lantus 10 units daily;  glipizide 10 mg twice daily and novolog 5 units prior to meals for cbg >=150  3. Dementia: is without significant change in status; will continue namenda 10 mg twice daily will stop her remeron 7.5 mg at this time as her weight is stable and will continue to monitor her status   Will check cbc; cmp; hgb a1c next draw

## 2012-11-11 NOTE — Progress Notes (Signed)
Patient ID: Brenda Santos, female   DOB: 1927/02/06, 79 y.o.   MRN: [id number]  [facility]  No Known Allergies  Chief Complaint  Patient presents with  . Medical Managment of Chronic Issues    HPI  She is being seen for the management of her chronic illnesses. She is unable to participate in the hpi or ros. There are no concerns being voiced by the nursing staff at this time. Overall her status remains without change over the recent past.   Past Medical History  Diagnosis Date  . Osteoarthrosis, generalized, involving multiple sites   . Vascular dementia   . Esophageal reflux   . DM (diabetes mellitus) type II controlled, neurological manifestation   . Hypertension   . Pacemaker   . Cerebrovascular disease     No past surgical history on file.  Filed Vitals:   06/17/12 1518  BP: 123/75  Pulse: 87  Height: 5\' 8"  (1.727 m)  Weight: 230 lb (104.327 kg)    MEDICATIONS  Asa 81 mg daily Ca++600/400 twice daily Glipizide 10 mg twice daily lantus 10 units daily namenda 10 mg twice daily prilosec 20 mg daily novolog 5 units prior to meals for cbg >=150    LABS REVIEWED  04-30-12: wbc 8.4; hgb 10.9; hct 33.8; mcv 76.;3 plt 266; glucose 144; bun 22; creat 1.17; k+4.2; na++141; liver normal albumin 3.1; hgb a1c 8.3    Review of Systems  Unable to perform ROS   Physical Exam  Constitutional: She appears well-developed and well-nourished. No distress.  obese  Neck: Neck supple. No JVD present.  Cardiovascular: Normal rate, regular rhythm and intact distal pulses.   Respiratory: Effort normal and breath sounds normal. No respiratory distress. She has no wheezes.  GI: Soft. Bowel sounds are normal. She exhibits no distension. There is no tenderness.  Musculoskeletal: Normal range of motion. She exhibits no edema.  Neurological: She is alert.  Skin: Skin is warm and dry. She is not diaphoretic.     ASSESSMENT/PLAN  1. Vascular dementia: no change in status; no  reports of behavioral issues present: will change her namenda to xr 28 mg daily; will continue asa 81 mg daily and will monitor  2. Diabetes: she is stable will continue glipizide 10 mg twice daily; lantus 10 units daily; and novolog 5 units prior to meals for cbg >=150  3. Hypertension: she is stable is not taking medications at this time; will continue to monitor her status  4. Gerd: will continue her prilosec 20 mg daily and will monitor

## 2012-12-03 ENCOUNTER — Emergency Department (HOSPITAL_COMMUNITY)
Admission: EM | Admit: 2012-12-03 | Discharge: 2012-12-03 | Disposition: A | Discharge status: Home / Self Care | Payer: Medicare Other | Attending: Emergency Medicine | Admitting: Emergency Medicine

## 2012-12-03 ENCOUNTER — Encounter (HOSPITAL_COMMUNITY): Payer: Self-pay | Admitting: Emergency Medicine

## 2012-12-03 ENCOUNTER — Emergency Department (HOSPITAL_COMMUNITY): Payer: Medicare Other

## 2012-12-03 DIAGNOSIS — K219 Gastro-esophageal reflux disease without esophagitis: Secondary | ICD-10-CM | POA: Insufficient documentation

## 2012-12-03 DIAGNOSIS — E1142 Type 2 diabetes mellitus with diabetic polyneuropathy: Secondary | ICD-10-CM | POA: Diagnosis not present

## 2012-12-03 DIAGNOSIS — Z79899 Other long term (current) drug therapy: Secondary | ICD-10-CM | POA: Insufficient documentation

## 2012-12-03 DIAGNOSIS — K920 Hematemesis: Secondary | ICD-10-CM | POA: Diagnosis not present

## 2012-12-03 DIAGNOSIS — G309 Alzheimer's disease, unspecified: Secondary | ICD-10-CM | POA: Insufficient documentation

## 2012-12-03 DIAGNOSIS — F0280 Dementia in other diseases classified elsewhere without behavioral disturbance: Secondary | ICD-10-CM | POA: Insufficient documentation

## 2012-12-03 DIAGNOSIS — E1149 Type 2 diabetes mellitus with other diabetic neurological complication: Secondary | ICD-10-CM | POA: Insufficient documentation

## 2012-12-03 DIAGNOSIS — Z950 Presence of cardiac pacemaker: Secondary | ICD-10-CM | POA: Insufficient documentation

## 2012-12-03 DIAGNOSIS — R1110 Vomiting, unspecified: Secondary | ICD-10-CM | POA: Diagnosis present

## 2012-12-03 DIAGNOSIS — M1990 Unspecified osteoarthritis, unspecified site: Secondary | ICD-10-CM | POA: Diagnosis not present

## 2012-12-03 DIAGNOSIS — Z7982 Long term (current) use of aspirin: Secondary | ICD-10-CM | POA: Insufficient documentation

## 2012-12-03 DIAGNOSIS — I10 Essential (primary) hypertension: Secondary | ICD-10-CM | POA: Diagnosis not present

## 2012-12-03 DIAGNOSIS — Z794 Long term (current) use of insulin: Secondary | ICD-10-CM | POA: Insufficient documentation

## 2012-12-03 LAB — COMPREHENSIVE METABOLIC PANEL
Alkaline Phosphatase: 100 U/L (ref 39–117)
BUN: 16 mg/dL (ref 6–23)
CO2: 25 mEq/L (ref 19–32)
Chloride: 105 mEq/L (ref 96–112)
GFR calc Af Amer: 40 mL/min — ABNORMAL LOW (ref >90)
GFR calc non Af Amer: 34 mL/min — ABNORMAL LOW (ref >90)
Glucose, Bld: 147 mg/dL — ABNORMAL HIGH (ref 70–99)
Potassium: 3.7 mEq/L (ref 3.5–5.1)
Total Bilirubin: 0.4 mg/dL (ref 0.3–1.2)

## 2012-12-03 LAB — CBC
HCT: 43.3 % (ref 36.0–46.0)
Hemoglobin: 13.8 g/dL (ref 12.0–15.0)
RBC: 5.29 MIL/uL — ABNORMAL HIGH (ref 3.87–5.11)
WBC: 6.3 10*3/uL (ref 4.0–10.5)

## 2012-12-03 LAB — SAMPLE TO BLOOD BANK

## 2012-12-03 LAB — LIPASE, BLOOD: Lipase: 20 U/L (ref 11–59)

## 2012-12-03 MED ORDER — ONDANSETRON 8 MG PO TBDP: 8.0000 mg | ORAL_TABLET | Freq: Three times a day (TID) | ORAL | Status: AC | PRN

## 2012-12-03 NOTE — ED Notes (Signed)
Patient resting in position of comfort with eyes closed RR WNL--even and unlabored with equal rise and fall of chest Patient in NAD Side rails up, call bell in reach  Continue to await arrival of [facility] to transport patient back to [facility]

## 2012-12-03 NOTE — ED Notes (Signed)
Writer and 2 other staff member attempted four times to draw labs, unsuccessful.  Main lab will be called to draw blood work.

## 2012-12-03 NOTE — ED Notes (Signed)
Report called to [facility] called and made aware of transport need

## 2012-12-03 NOTE — ED Provider Notes (Signed)
CSN: [id number]     Arrival date & time 12/03/12  0003 History   First MD Initiated Contact with Patient 12/03/12 0028     Chief Complaint  Patient presents with  . Emesis   (Consider location/radiation/quality/duration/timing/severity/associated sxs/prior Treatment) HPI  79 year old female presents to the emergency department via EMS from her nursing home facility with complaint of coffee-ground emesis x2 today.  Patient has Alzheimer's, and is nonverbal, cannot give any history.  Prior charts reviewed, patient has history of upper GI bleed with hematemesis in April.  It appears that Dr. Elnoria Howard, did an upper endoscopy.  Discussed with family, they report no specific findings.  Patient is on Zantac.  No reported fevers or diarrhea.  Past Medical History  Diagnosis Date  . Osteoarthrosis, generalized, involving multiple sites   . Vascular dementia   . Esophageal reflux   . DM (diabetes mellitus) type II controlled, neurological manifestation   . Hypertension   . Pacemaker   . Cerebrovascular disease    History reviewed. No pertinent past surgical history. History reviewed. No pertinent family history. History  Substance Use Topics  . Smoking status: Never Smoker   . Smokeless tobacco: Not on file  . Alcohol Use: No   OB History   Grav Para Term Preterm Abortions TAB SAB Ect Mult Living                 Review of Systems  Unable to perform ROS: Dementia    Allergies  Review of patient's allergies indicates no known allergies.  Home Medications   Current Outpatient Rx  Name  Route  Sig  Dispense  Refill  . acetaminophen (TYLENOL) 325 MG tablet   Oral   Take 650 mg by mouth every 4 (four) hours as needed. For pain or fever         . Aspirin 81 MG EC tablet   Oral   Take 81 mg by mouth daily.         . Calcium Carbonate-Vitamin D (CALTRATE 600+D) 600-400 MG-UNIT per tablet   Oral   Take 1 tablet by mouth 2 (two) times daily.         Marland Kitchen glipiZIDE (GLUCOTROL) 10 MG  tablet   Oral   Take 10 mg by mouth daily.          . insulin aspart (NOVOLOG) 100 UNIT/ML injection   Subcutaneous   Inject 5 Units into the skin 3 (three) times daily before meals. If cbg >150         . insulin glargine (LANTUS) 100 UNIT/ML injection   Subcutaneous   Inject 10 Units into the skin at bedtime.         . Memantine HCl ER (NAMENDA XR) 28 MG CP24   Oral   Take 28 mg by mouth daily.         . Multiple Vitamins-Minerals (DECUBI-VITE) CAPS   Oral   Take 2 capsules by mouth daily.         Marland Kitchen omeprazole (PRILOSEC) 20 MG capsule   Oral   Take 20 mg by mouth daily before breakfast.          BP 98/65  Pulse 67  Temp(Src) 98.1 F (36.7 C) (Oral)  Resp 20  SpO2 100% Physical Exam  Nursing note and vitals reviewed. Constitutional: She appears well-developed and well-nourished. No distress.  HENT:  Head: Normocephalic and atraumatic.  Nose: Nose normal.  Mouth/Throat: Oropharynx is clear and moist. No oropharyngeal exudate.  Eyes: Conjunctivae and EOM are normal. Pupils are equal, round, and reactive to light.  Neck: Normal range of motion. Neck supple. No JVD present. No tracheal deviation present. No thyromegaly present.  Cardiovascular: Normal rate, regular rhythm, normal heart sounds and intact distal pulses.  Exam reveals no gallop and no friction rub.   No murmur heard. Pulmonary/Chest: No stridor.  Abdominal: Soft. Bowel sounds are normal. She exhibits no distension and no mass. There is tenderness (patient grimaces with palpation of epigastrium and suprapubic region.  No masses appreciated.  No rebound or guarding.). There is no rebound and no guarding.  Musculoskeletal: Normal range of motion. She exhibits no edema and no tenderness.  Lymphadenopathy:    She has no cervical adenopathy.  Neurological: She is alert.  Skin: Skin is warm and dry. No rash noted. No erythema. No pallor.    ED Course  Procedures (including critical care time) Labs  Review Labs Reviewed  CBC - Abnormal; Notable for the following:    RBC 5.29 (*)    RDW 17.4 (*)    All other components within normal limits  COMPREHENSIVE METABOLIC PANEL - Abnormal; Notable for the following:    Glucose, Bld 147 (*)    Creatinine, Ser 1.39 (*)    Albumin 3.2 (*)    GFR calc non Af Amer 34 (*)    GFR calc Af Amer 40 (*)    All other components within normal limits  LIPASE, BLOOD  OCCULT BLOOD X 1 CARD TO LAB, STOOL  OCCULT BLOOD, POC DEVICE  POCT GASTRIC OCCULT BLOOD (1-CARD TO LAB)  SAMPLE TO BLOOD BANK   Imaging Review Dg Chest Port 1 View  12/03/2012   CLINICAL DATA:  Cough and vomiting.  EXAM: PORTABLE CHEST - 1 VIEW  COMPARISON:  04/26/2011  FINDINGS: Pacer with leads at right atrium and right ventricle. No lead discontinuity. Minimal motion degradation. Apical lordotic positioning. Midline trachea. Moderate cardiomegaly. No pleural effusion or pneumothorax. No congestive failure. Clear lungs. No free intraperitoneal air.  IMPRESSION: Cardiomegaly without congestive failure.   Electronically Signed   By: Jeronimo Greaves M.D.   On: 12/03/2012 02:11    EKG Interpretation   None       MDM   1. Hematemesis    79 year old female with reported 2 episodes of coffee-ground emesis.  Today.  H&H is stable.  She's had no further vomiting here.  No blood noted in Hemoccult.  No elevation in BUN.  Patient has been hemodynamically stable.  She has slight elevation of creatinine, from prior.  Will have patient followup with her primary care doctor, as well as her GI physician.  We'll start her on Zofran for nausea and vomiting.    Olivia Mackie, MD 12/03/12 [phone]

## 2012-12-03 NOTE — ED Notes (Signed)
Per EMS, pt from [facility], staff reported pt had x2 episodes of coffee ground emesis today. Pt's daughter reports hx of this in the past with no known cause. Pt has hx Alzheimer's, non-verbal, can become violent. NAD noted at this time.

## 2012-12-03 NOTE — ED Notes (Signed)
Bed: WU98 Expected date:  Expected time:  Means of arrival:  Comments: EMS 79yo F, ? GI bleed, hx alzheimer's

## 2012-12-03 NOTE — ED Notes (Signed)
Patient's family informed of DC back to [facility]--agree and v/u Will call report to [facility] and then call [facility] to arrange transport

## 2012-12-08 ENCOUNTER — Non-Acute Institutional Stay (SKILLED_NURSING_FACILITY): Payer: Medicare Other | Admitting: Nurse Practitioner

## 2012-12-08 DIAGNOSIS — K219 Gastro-esophageal reflux disease without esophagitis: Secondary | ICD-10-CM

## 2012-12-08 DIAGNOSIS — E1149 Type 2 diabetes mellitus with other diabetic neurological complication: Secondary | ICD-10-CM

## 2012-12-08 DIAGNOSIS — M159 Polyosteoarthritis, unspecified: Secondary | ICD-10-CM

## 2012-12-08 DIAGNOSIS — I10 Essential (primary) hypertension: Secondary | ICD-10-CM

## 2012-12-08 DIAGNOSIS — F0150 Vascular dementia without behavioral disturbance: Secondary | ICD-10-CM

## 2012-12-08 NOTE — Progress Notes (Signed)
Patient ID: Brenda Santos, female   DOB: 1927/04/14, 79 y.o.   MRN: [id number]    Nursing Home Location:  [facility]   Place of Service: SNF (31)  PCP: REED, TIFFANY, DO  No Known Allergies  Chief Complaint  Patient presents with  . Medical Managment of Chronic Issues    HPI:  79 year old who is a LTR of [facility] with pmh of advanced dementia, GERD, HTN, DM is being seen today for routine follow up. Pt unable to participate in ROS or HPI, nursing does not have any concerns at this time.   Review of Systems:  Unable to obtain due to dementia  Past Medical History  Diagnosis Date  . Osteoarthrosis, generalized, involving multiple sites   . Vascular dementia   . Esophageal reflux   . DM (diabetes mellitus) type II controlled, neurological manifestation   . Hypertension   . Pacemaker   . Cerebrovascular disease    No past surgical history on file. Social History:   reports that she has never smoked. She does not have any smokeless tobacco history on file. She reports that she does not drink alcohol or use illicit drugs.  No family history on file.  Medications: Patient's Medications  New Prescriptions   No medications on file  Previous Medications   CALCIUM CARBONATE-VITAMIN D (CALTRATE 600+D) 600-400 MG-UNIT PER TABLET    Take 1 tablet by mouth 2 (two) times daily.   GLIPIZIDE (GLUCOTROL) 10 MG TABLET    Take 10 mg by mouth daily.    INSULIN ASPART (NOVOLOG) 100 UNIT/ML INJECTION    Inject 5 Units into the skin 3 (three) times daily before meals. If cbg >150   INSULIN GLARGINE (LANTUS) 100 UNIT/ML INJECTION    Inject 14 Units into the skin at bedtime.    MEMANTINE HCL ER (NAMENDA XR) 28 MG CP24    Take 28 mg by mouth daily.   ONDANSETRON (ZOFRAN ODT) 8 MG DISINTEGRATING TABLET    Take 1 tablet (8 mg total) by mouth every 8 (eight) hours as needed for nausea or vomiting.   RANITIDINE (ZANTAC) 150 MG TABLET    Take 150 mg by mouth at bedtime.    Modified Medications   No medications on file  Discontinued Medications   No medications on file     Physical Exam: Physical Exam  Constitutional: She is well-developed, well-nourished, and in no distress. No distress.  HENT:  Mouth/Throat: Oropharynx is clear and moist. No oropharyngeal exudate.  Eyes: Conjunctivae and EOM are normal. Pupils are equal, round, and reactive to light.  Neck: Normal range of motion. Neck supple.  Cardiovascular: Normal rate, regular rhythm and normal heart sounds.   Pulmonary/Chest: Effort normal and breath sounds normal. No respiratory distress.  Abdominal: Soft. Bowel sounds are normal. She exhibits no distension.  Musculoskeletal: She exhibits no edema and no tenderness.  Self propels with WC  Neurological: She is alert.  Skin: Skin is warm and dry. She is not diaphoretic.     Filed Vitals:   12/08/12 1538  BP: 145/78  Pulse: 98  Temp: 97.2 F (36.2 C)  Resp: 20      Labs reviewed: 04/30/2012: wbc 8.4, rbc 4.43, hgb 10.9, hct 33.8  Sodium 141, potassium 4.2, glucose 144, BUN 22, Cr 1.17  A1c8.3  Iron and IBC  Result: 09/29/2012 11:18 AM ( Status: F ) C  Iron 67 42-145 ug/dL SLN  UIBC 829 562-130 ug/dL SLN  TIBC 865 784-696  ug/dL SLN  %SAT 19 L 09-81 % SLN  CBC NO Diff (Complete Blood Count)  Result: 09/29/2012 12:03 PM ( Status: F )  WBC 6.6 4.0-10.5 K/uL SLN  RBC 4.75 3.87-5.11 MIL/uL SLN  Hemoglobin 11.8 L 12.0-15.0 g/dL SLN  Hematocrit 19.1 47.8-29.5 % SLN  MCV 77.1 L 78.0-100.0 fL SLN  MCH 24.8 L 26.0-34.0 pg SLN  MCHC 32.2 30.0-36.0 g/dL SLN  RDW 62.1 H 30.8-65.7 % SLN  Platelet Count 267 150-400 K/uL SLN  Basic Metabolic Panel  Result: 09/29/2012 11:18 AM ( Status: F )  Sodium 138 135-145 mEq/L SLN  Potassium 4.3 3.5-5.3 mEq/L SLN  Chloride 107 96-112 mEq/L SLN  CO2 24 19-32 mEq/L SLN  Glucose 98 70-99 mg/dL SLN  BUN 15 8-46 mg/dL SLN  Creatinine 9.62 H 0.50-1.10 mg/dL SLN  Calcium 9.0 9.5-28.4 mg/dL SLN  Ferritin   Result: 09/29/2012 11:13 AM ( Status: F )  Ferritin 28 10-291 ng/mL SLN  Hemoglobin A1C  Result: 09/29/2012 1:38 PM ( Status: F )  Hemoglobin A1C 8.1 H <5.7 % SLN C  Estimated Average Glucose 186 H <117 mg/dL S  Basic Metabolic Panel:  Recent Labs  13/24/40 0125  NA 141  K 3.7  CL 105  CO2 25  GLUCOSE 147*  BUN 16  CREATININE 1.39*  CALCIUM 10.0   Liver Function Tests:  Recent Labs  12/03/12 0125  AST 17  ALT 18  ALKPHOS 100  BILITOT 0.4  PROT 7.1  ALBUMIN 3.2*    Recent Labs  12/03/12 0125  LIPASE 20   No results found for this basename: AMMONIA,  in the last 8760 hours CBC:  Recent Labs  12/03/12 0125  WBC 6.3  HGB 13.8  HCT 43.3  MCV 81.9  PLT 209     Assessment/Plan 1. Osteoarthrosis, generalized, involving multiple sites Stable on current medications   2. Vascular dementia without behavioral disturbance -stable at this time, no changes in behavior in the last month  3. Type II or unspecified type diabetes mellitus with neurological manifestations, uncontrolled(250.62) -blood sugars have been within normal limits on review; will cont current medications; will follow up A1c at the beginning of January   4. Esophageal reflux -Patient is stable; continue current regimen. Will monitor and make changes as necessary.  5. Hypertension -stable at this time, will cont current medications

## 2013-02-12 ENCOUNTER — Non-Acute Institutional Stay (SKILLED_NURSING_FACILITY): Payer: Medicare Other | Admitting: Internal Medicine

## 2013-02-12 ENCOUNTER — Encounter: Payer: Self-pay | Admitting: Internal Medicine

## 2013-02-12 DIAGNOSIS — D509 Iron deficiency anemia, unspecified: Secondary | ICD-10-CM

## 2013-02-12 DIAGNOSIS — N183 Chronic kidney disease, stage 3 unspecified: Secondary | ICD-10-CM | POA: Insufficient documentation

## 2013-02-12 DIAGNOSIS — I10 Essential (primary) hypertension: Secondary | ICD-10-CM

## 2013-02-12 DIAGNOSIS — K219 Gastro-esophageal reflux disease without esophagitis: Secondary | ICD-10-CM

## 2013-02-12 DIAGNOSIS — F0150 Vascular dementia without behavioral disturbance: Secondary | ICD-10-CM

## 2013-02-12 DIAGNOSIS — M159 Polyosteoarthritis, unspecified: Secondary | ICD-10-CM

## 2013-02-12 DIAGNOSIS — E1149 Type 2 diabetes mellitus with other diabetic neurological complication: Secondary | ICD-10-CM

## 2013-02-12 NOTE — Assessment & Plan Note (Signed)
HbA1c 7.6 in 01/02/2013 which is acceptable control  In this pt; continue present regimen

## 2013-02-12 NOTE — Assessment & Plan Note (Signed)
01/2013 H/h was 12.5/38.2  On no supplements; MCV 81; will monitor

## 2013-02-12 NOTE — Assessment & Plan Note (Signed)
Sx are controlled with zantac 150 mg at bedtime

## 2013-02-12 NOTE — Assessment & Plan Note (Addendum)
12/2012 GFR was 40.1, BUN 20/Cr 1.34, stable from 3 years ago

## 2013-02-12 NOTE — Progress Notes (Signed)
MRN: [id number] Name: Brenda Santos  Sex: female Age: 80 y.o. DOB: 1927-10-23  PSC #: Ronni Rumble Facility/Room: 233B Level Of Care: SNF Provider: Merrilee Seashore D Emergency Contacts: Extended Emergency Contact Information Primary Emergency Contact: Bethea,Sarah Address: [address]          [place], [place] [address] [place] Home Phone: [phone] Mobile Phone: [phone] Relation: Daughter Secondary Emergency Contact: Cobb,Lena Address: [address]          [place], [place] [address] [place] Home Phone: [phone] Relation: Daughter  Code Status: FULL  Allergies: Review of patient's allergies indicates no known allergies.  Chief Complaint  Patient presents with  . Medical Managment of Chronic Issues    HPI: Patient is 80 y.o. female who is being seen for routine problems.  Past Medical History  Diagnosis Date  . Vascular dementia   . Esophageal reflux   . DM (diabetes mellitus) type II controlled, neurological manifestation   . Hypertension   . Pacemaker   . Cerebrovascular disease   . Osteoarthrosis, generalized, involving multiple sites     History reviewed. No pertinent past surgical history.    Medication List       This list is accurate as of: 02/12/13  5:00 PM.  Always use your most recent med list.               aspirin 81 MG tablet  Take 81 mg by mouth daily.     CALTRATE 600+D 600-400 MG-UNIT per tablet  Generic drug:  Calcium Carbonate-Vitamin D  Take 1 tablet by mouth 2 (two) times daily.     glipiZIDE 10 MG tablet  Commonly known as:  GLUCOTROL  Take 10 mg by mouth daily.     insulin aspart 100 UNIT/ML injection  Commonly known as:  novoLOG  Inject 5 Units into the skin 3 (three) times daily before meals. If cbg >150     insulin glargine 100 UNIT/ML injection  Commonly known as:  LANTUS  Inject 14 Units into the skin at bedtime.     NAMENDA XR 28 MG Cp24  Generic drug:  Memantine HCl ER   Take 28 mg by mouth daily.     ondansetron 8 MG disintegrating tablet  Commonly known as:  ZOFRAN ODT  Take 1 tablet (8 mg total) by mouth every 8 (eight) hours as needed for nausea or vomiting.     ranitidine 150 MG tablet  Commonly known as:  ZANTAC  Take 150 mg by mouth at bedtime.        Meds ordered this encounter  Medications  . aspirin 81 MG tablet    Sig: Take 81 mg by mouth daily.    Immunization History  Administered Date(s) Administered  . Influenza Whole 10/22/2012    History  Substance Use Topics  . Smoking status: Never Smoker   . Smokeless tobacco: Not on file  . Alcohol Use: No    Review of Systems    UTO, pt did not speak;nurses do not voice concerns   Filed Vitals:   02/12/13 1648  BP: 101/71  Pulse: 83  Temp: 98.7 F (37.1 C)  Resp: 16    Physical Exam  GENERAL APPEARANCE: Alert, nonconversant. Appropriately groomed. No acute distress  SKIN: No diaphoresis rash, or wounds HEENT: Unremarkable RESPIRATORY: Breathing is even, unlabored. Lung sounds are clear   CARDIOVASCULAR: Heart RRR no murmurs, rubs or gallops. No peripheral edema  GASTROINTESTINAL: Abdomen is soft,  non-tender, not distended w/ normal bowel sounds.  GENITOURINARY: Bladder non tender, not distended  MUSCULOSKELETAL: No abnormal joints or musculature NEUROLOGIC: Cranial nerves 2-12 grossly intact. Moves all extremities no tremor. PSYCHIATRIC: no behavioral issues  Patient Active Problem List   Diagnosis Date Noted  . Chronic renal disease, stage 3, moderately decreased glomerular filtration rate between 30-59 mL/min/1.73 square meter 02/12/2013  . Microcytic anemia 09/28/2012  . Vascular dementia without behavioral disturbance   . Type II or unspecified type diabetes mellitus with neurological manifestations, uncontrolled(250.62)   . Esophageal reflux   . Osteoarthrosis, generalized, involving multiple sites   . Hypertension   . Pacemaker     CBC    Component  Value Date/Time   WBC 6.3 12/03/2012 0125   RBC 5.29* 12/03/2012 0125   HGB 13.8 12/03/2012 0125   HCT 43.3 12/03/2012 0125   PLT 209 12/03/2012 0125   MCV 81.9 12/03/2012 0125   LYMPHSABS 2.8 04/26/2011 1248   MONOABS 0.9 04/26/2011 1248   EOSABS 0.2 04/26/2011 1248   BASOSABS 0.1 04/26/2011 1248    CMP     Component Value Date/Time   NA 141 12/03/2012 0125   K 3.7 12/03/2012 0125   CL 105 12/03/2012 0125   CO2 25 12/03/2012 0125   GLUCOSE 147* 12/03/2012 0125   BUN 16 12/03/2012 0125   CREATININE 1.39* 12/03/2012 0125   CALCIUM 10.0 12/03/2012 0125   PROT 7.1 12/03/2012 0125   ALBUMIN 3.2* 12/03/2012 0125   AST 17 12/03/2012 0125   ALT 18 12/03/2012 0125   ALKPHOS 100 12/03/2012 0125   BILITOT 0.4 12/03/2012 0125   GFRNONAA 34* 12/03/2012 0125   GFRAA 40* 12/03/2012 0125    Assessment and Plan  Vascular dementia without behavioral disturbance Pt on Namenda and ASA 81 mg; moderate dementia  Hypertension Pt controlled on no meds  Esophageal reflux Sx are controlled with zantac 150 mg at bedtime  Type II or unspecified type diabetes mellitus with neurological manifestations, uncontrolled(250.62) HbA1c 7.6 in 01/02/2013 which is acceptable control  In this pt; continue present regimen  Microcytic anemia 01/2013 H/h was 12.5/38.2  On no supplements; MCV 81; will monitor  Chronic renal disease, stage 3, moderately decreased glomerular filtration rate between 30-59 mL/min/1.73 square meter 12/2012 GFR was 40.1, BUN 20/Cr 1.34, stable from 3 years ago   Osteoarthrosis, generalized, involving multiple sites Pt on Ca and Vit D    ALEXANDER, Randon GoldsmithANNE D, MD

## 2013-02-12 NOTE — Assessment & Plan Note (Signed)
Pt controlled on no meds

## 2013-02-12 NOTE — Assessment & Plan Note (Signed)
Pt on Ca and Vit D

## 2013-02-12 NOTE — Assessment & Plan Note (Signed)
Pt on Namenda and ASA 81 mg; moderate dementia

## 2013-04-22 ENCOUNTER — Non-Acute Institutional Stay (SKILLED_NURSING_FACILITY): Payer: Medicare Other | Admitting: Internal Medicine

## 2013-04-22 ENCOUNTER — Encounter: Payer: Self-pay | Admitting: Internal Medicine

## 2013-04-22 DIAGNOSIS — I150 Renovascular hypertension: Secondary | ICD-10-CM

## 2013-04-22 DIAGNOSIS — I151 Hypertension secondary to other renal disorders: Secondary | ICD-10-CM

## 2013-04-22 DIAGNOSIS — E1149 Type 2 diabetes mellitus with other diabetic neurological complication: Secondary | ICD-10-CM

## 2013-04-22 DIAGNOSIS — N183 Chronic kidney disease, stage 3 unspecified: Secondary | ICD-10-CM

## 2013-04-22 DIAGNOSIS — F0150 Vascular dementia without behavioral disturbance: Secondary | ICD-10-CM

## 2013-04-22 DIAGNOSIS — M159 Polyosteoarthritis, unspecified: Secondary | ICD-10-CM

## 2013-04-22 DIAGNOSIS — K219 Gastro-esophageal reflux disease without esophagitis: Secondary | ICD-10-CM

## 2013-04-22 DIAGNOSIS — N2889 Other specified disorders of kidney and ureter: Secondary | ICD-10-CM

## 2013-04-22 NOTE — Progress Notes (Signed)
Patient ID: Brenda CluckGladys Santos, female   DOB: [birth year], 80 y.o.   MRN: [id number]  [facility]  [facility] Provider:  Gwenith Spitziffany L. Renato Gailseed, D.O., C.M.D.  Code Status:  Full code   Chief Complaint  Patient presents with  . Medical Management of Chronic Issues    HPI:  80 yo black female long term care residnet seen for med mgt chronic diseases.  She has no complaints, doing well, about to attend bingo.  Review of Systems:  Review of Systems  Constitutional: Negative for fever and malaise/fatigue.  HENT: Negative for congestion.   Eyes: Negative for blurred vision.  Respiratory: Negative for cough and shortness of breath.   Cardiovascular: Negative for chest pain.  Gastrointestinal: Negative for abdominal pain, constipation, blood in stool and melena.  Genitourinary: Negative for dysuria.  Musculoskeletal: Negative for falls, joint pain and myalgias.  Skin: Negative for rash.  Neurological: Negative for dizziness.  Psychiatric/Behavioral: Positive for memory loss. Negative for depression.    Medications: Patient's Medications  New Prescriptions   No medications on file  Previous Medications   ASPIRIN 81 MG TABLET    Take 81 mg by mouth daily.   CALCIUM CARBONATE-VITAMIN D (CALTRATE 600+D) 600-400 MG-UNIT PER TABLET    Take 1 tablet by mouth 2 (two) times daily.   GLIPIZIDE (GLUCOTROL) 10 MG TABLET    Take 10 mg by mouth daily.    INSULIN ASPART (NOVOLOG) 100 UNIT/ML INJECTION    Inject 5 Units into the skin 3 (three) times daily before meals. If cbg >150   INSULIN GLARGINE (LANTUS) 100 UNIT/ML INJECTION    Inject 14 Units into the skin at bedtime.    MEMANTINE HCL ER (NAMENDA XR) 28 MG CP24    Take 28 mg by mouth daily.   ONDANSETRON (ZOFRAN ODT) 8 MG DISINTEGRATING TABLET    Take 1 tablet (8 mg total) by mouth every 8 (eight) hours as needed for nausea or vomiting.   RANITIDINE (ZANTAC) 150 MG TABLET    Take 150 mg by mouth at bedtime.  Modified Medications   No  medications on file  Discontinued Medications   No medications on file    Physical Exam: Filed Vitals:   04/22/13 1255  BP: 130/70  Pulse: 74  Temp: 97.8 F (36.6 C)  Resp: 18  Height: 5\' 8"  (1.727 m)  Weight: 190 lb (86.183 kg)  SpO2: 98%  Physical Exam  Constitutional: She appears well-developed and well-nourished. No distress.  Cardiovascular: Normal rate, regular rhythm, normal heart sounds and intact distal pulses.   Pulmonary/Chest: Effort normal and breath sounds normal. No respiratory distress.  Abdominal: Soft. Bowel sounds are normal. She exhibits no distension and no mass. There is no tenderness.  Musculoskeletal: Normal range of motion. She exhibits no edema and no tenderness.  Neurological: She is alert.  Answers questions appropriately  Psychiatric: She has a normal mood and affect.     Labs reviewed: Basic Metabolic Panel:  Recent Labs  40/98/1110/03/15 0125  NA 141  K 3.7  CL 105  CO2 25  GLUCOSE 147*  BUN 16  CREATININE 1.39*  CALCIUM 10.0    Liver Function Tests:  Recent Labs  12/03/12 0125  AST 17  ALT 18  ALKPHOS 100  BILITOT 0.4  PROT 7.1  ALBUMIN 3.2*    CBC:  Recent Labs  12/03/12 0125  WBC 6.3  HGB 13.8  HCT 43.3  MCV 81.9  PLT 209   01/02/13:  Glucose 103, BUN  15, cr 1.17, Na 139, K 4, wbc 5.4, h/h 12.5, 38.2, plt 360, hba1c 7.6  Assessment/Plan 1. Vascular dementia without behavioral disturbance Slight progression noted, but continues to participate in activities and partake in some of her bathing, dressing, grooming routine on her own, feeds self 2. DM (diabetes mellitus) type II controlled, neurological manifestation -f/u hba1c -cont glipizide, lantus with meal coverage -if develops any hypoglycemia, would d/c glipizide first  3. Chronic renal disease, stage 3, moderately decreased glomerular filtration rate between 30-59 mL/min/1.73 square meter -avoid nephrotoxic agents, monitor  4. Hypertension secondary to other  renal disorders -bp at goal w/o any meds at this point  5. Osteoarthrosis, generalized, involving multiple sites -cont tylenol if needed (standing order), but does not request  6. Gastroesophageal reflux disease without esophagitis -cont ranitidine prn  Family/ staff Communication: discussed with nurse  Goals of care: full code, long term care resident  Labs/tests ordered:  Hba1c, bmp next draw

## 2013-05-27 ENCOUNTER — Non-Acute Institutional Stay (SKILLED_NURSING_FACILITY): Payer: Medicare Other | Admitting: Internal Medicine

## 2013-05-27 DIAGNOSIS — N2889 Other specified disorders of kidney and ureter: Secondary | ICD-10-CM

## 2013-05-27 DIAGNOSIS — I151 Hypertension secondary to other renal disorders: Secondary | ICD-10-CM

## 2013-05-27 DIAGNOSIS — I150 Renovascular hypertension: Secondary | ICD-10-CM

## 2013-05-27 DIAGNOSIS — F0150 Vascular dementia without behavioral disturbance: Secondary | ICD-10-CM

## 2013-05-27 DIAGNOSIS — N183 Chronic kidney disease, stage 3 unspecified: Secondary | ICD-10-CM

## 2013-05-27 DIAGNOSIS — E1149 Type 2 diabetes mellitus with other diabetic neurological complication: Secondary | ICD-10-CM

## 2013-05-27 DIAGNOSIS — K219 Gastro-esophageal reflux disease without esophagitis: Secondary | ICD-10-CM

## 2013-05-27 DIAGNOSIS — M159 Polyosteoarthritis, unspecified: Secondary | ICD-10-CM

## 2013-05-27 NOTE — Progress Notes (Signed)
[facility] Bernetta Sutley L. Renato Gails, D.O., C.M.D.  PCP: No primary care provider on file.   No Known Allergies  Chief Complaint  Patient presents with  . Discharge Note    HPI:  80 yo black female long term care resident will now be discharging to [facility] due to her excellent condition.  Review of Systems:  Review of Systems  Constitutional: Negative for fever.  HENT: Negative for congestion.   Eyes: Positive for blurred vision.  Respiratory: Negative for shortness of breath.   Cardiovascular: Negative for chest pain.  Gastrointestinal: Positive for heartburn. Negative for abdominal pain.  Genitourinary: Negative for dysuria.  Musculoskeletal: Positive for joint pain and falls.  Skin: Negative for rash.  Neurological: Negative for dizziness.  Psychiatric/Behavioral: Positive for memory loss.     Past Medical History  Diagnosis Date  . Vascular dementia   . Esophageal reflux   . DM (diabetes mellitus) type II controlled, neurological manifestation   . Hypertension   . Pacemaker   . Cerebrovascular disease   . Osteoarthrosis, generalized, involving multiple sites     No past surgical history on file.  Social History:   reports that she has never smoked. She does not have any smokeless tobacco history on file. She reports that she does not drink alcohol or use illicit drugs.  No family history on file.  Medications: Patient's Medications  New Prescriptions   No medications on file  Previous Medications   ACETAMINOPHEN (TYLENOL) 325 MG TABLET    Take 650 mg by mouth every 6 (six) hours as needed for mild pain or fever.   ALUM & MAG HYDROXIDE-SIMETH (MAALOX/MYLANTA) 200-200-20 MG/5ML SUSPENSION    Take 30 mLs by mouth every 6 (six) hours as needed for indigestion or heartburn.   ASPIRIN 81 MG CHEWABLE TABLET    Chew by mouth daily.   CALCIUM CARBONATE-VITAMIN D (CALTRATE 600+D) 600-400 MG-UNIT PER TABLET    Take 1 tablet by mouth 2  (two) times daily.   DIVALPROEX (DEPAKOTE SPRINKLE) 125 MG CAPSULE    Take 250 mg by mouth 2 (two) times daily.   GLIPIZIDE (GLUCOTROL) 10 MG TABLET    Take 10 mg by mouth 2 (two) times daily before a meal.    GUAIFENESIN (ROBITUSSIN) 100 MG/5ML SYRUP    Take 200 mg by mouth 3 (three) times daily as needed for cough.   INSULIN ASPART (NOVOLOG) 100 UNIT/ML INJECTION    Inject 5 Units into the skin 3 (three) times daily after meals. If cbg >150   INSULIN GLARGINE (LANTUS) 100 UNIT/ML INJECTION    Inject 14 Units into the skin at bedtime.    LOPERAMIDE (IMODIUM) 2 MG CAPSULE    Take 2 mg by mouth as needed for diarrhea or loose stools.   LORATADINE (CLARITIN) 10 MG TABLET    Take 10 mg by mouth daily.   MEMANTINE HCL ER (NAMENDA XR) 28 MG CP24    Take 28 mg by mouth daily.   MIRTAZAPINE (REMERON) 7.5 MG TABLET    Take 7.5 mg by mouth at bedtime.   NEOMYCIN-BACITRACIN-POLYMYXIN (NEOSPORIN) OINTMENT    Apply 1 application topically as needed for wound care. apply to eye   ONDANSETRON (ZOFRAN ODT) 8 MG DISINTEGRATING TABLET    Take 1 tablet (8 mg total) by mouth every 8 (eight) hours as needed for nausea or vomiting.   RANITIDINE (ZANTAC) 150 MG TABLET    Take 150 mg by mouth 2 (two) times  daily.   VITAMIN D, ERGOCALCIFEROL, (DRISDOL) 50000 UNITS CAPS CAPSULE    Take 50,000 Units by mouth every 7 (seven) days.  Modified Medications   No medications on file  Discontinued Medications   ASPIRIN 81 MG TABLET    Take 81 mg by mouth daily.   RANITIDINE (ZANTAC) 150 MG TABLET    Take 150 mg by mouth at bedtime.    Physical Exam: Filed Vitals:   05/27/13 1055  BP: 128/54  Pulse: 82  Temp: 98 F (36.7 C)  Resp: 16  Height: 5\' 8"  (1.727 m)  Weight: 190 lb (86.183 kg)  SpO2: 98%   Physical Exam  Constitutional: She appears well-developed and well-nourished. No distress.  Cardiovascular: Normal rate, regular rhythm, normal heart sounds and intact distal pulses.   Pulmonary/Chest: Effort normal and  breath sounds normal. No respiratory distress.  Abdominal: Soft. Bowel sounds are normal. She exhibits no distension and no mass. There is no tenderness.  Musculoskeletal: Normal range of motion. She exhibits tenderness.  Gets around in wheelchair; tender right shoulder  Neurological: She is alert.  Oriented to person only     Assessment/Plan:   1. Vascular dementia without behavioral disturbance -very gradual cognitive decline -remains active in activities and speaks to staff when spoken to, rolls herself in wheelchair  2. DM (diabetes mellitus) type II controlled, neurological manifestation -on lantus, novolog and glipizide -no difficulty with hypoglycemia, great po intake Well controlled  3. Chronic renal disease, stage 3, moderately decreased glomerular filtration rate between 30-59 mL/min/1.73 square meter -cont to avoid nephrotoxic agents like nsaids, not on ace due to already depressed renal function and bp running low  4. Hypertension secondary to other renal disorders -bp at goal w/o meds  5. Osteoarthrosis, generalized, involving multiple sites -especially knees and shoulders, cont tylenol for pain  6. Gastroesophageal reflux disease without esophagitis -cont zantac  [facility] and 3-in-1 commode  Patient has been advised to f/u [facility] their PCP ([facility] in 1-2 weeks.

## 2013-07-08 DIAGNOSIS — IMO0001 Reserved for inherently not codable concepts without codable children: Secondary | ICD-10-CM

## 2013-07-08 DIAGNOSIS — I5021 Acute systolic (congestive) heart failure: Secondary | ICD-10-CM

## 2013-07-08 DIAGNOSIS — I69920 Aphasia following unspecified cerebrovascular disease: Secondary | ICD-10-CM

## 2013-07-08 DIAGNOSIS — I10 Essential (primary) hypertension: Secondary | ICD-10-CM

## 2013-09-17 ENCOUNTER — Emergency Department (HOSPITAL_COMMUNITY)
Admission: EM | Admit: 2013-09-17 | Discharge: 2013-09-17 | Disposition: A | Discharge status: Home / Self Care | Payer: Medicare Other | Attending: Emergency Medicine | Admitting: Emergency Medicine

## 2013-09-17 ENCOUNTER — Encounter (HOSPITAL_COMMUNITY): Payer: Self-pay | Admitting: Emergency Medicine

## 2013-09-17 DIAGNOSIS — M159 Polyosteoarthritis, unspecified: Secondary | ICD-10-CM | POA: Insufficient documentation

## 2013-09-17 DIAGNOSIS — Y9289 Other specified places as the place of occurrence of the external cause: Secondary | ICD-10-CM | POA: Diagnosis not present

## 2013-09-17 DIAGNOSIS — E119 Type 2 diabetes mellitus without complications: Secondary | ICD-10-CM | POA: Insufficient documentation

## 2013-09-17 DIAGNOSIS — Y9389 Activity, other specified: Secondary | ICD-10-CM | POA: Insufficient documentation

## 2013-09-17 DIAGNOSIS — F0390 Unspecified dementia without behavioral disturbance: Secondary | ICD-10-CM | POA: Insufficient documentation

## 2013-09-17 DIAGNOSIS — K219 Gastro-esophageal reflux disease without esophagitis: Secondary | ICD-10-CM | POA: Insufficient documentation

## 2013-09-17 DIAGNOSIS — Z7982 Long term (current) use of aspirin: Secondary | ICD-10-CM | POA: Diagnosis not present

## 2013-09-17 DIAGNOSIS — F0391 Unspecified dementia with behavioral disturbance: Secondary | ICD-10-CM | POA: Diagnosis not present

## 2013-09-17 DIAGNOSIS — F03918 Unspecified dementia, unspecified severity, with other behavioral disturbance: Secondary | ICD-10-CM | POA: Insufficient documentation

## 2013-09-17 DIAGNOSIS — Z794 Long term (current) use of insulin: Secondary | ICD-10-CM | POA: Insufficient documentation

## 2013-09-17 DIAGNOSIS — Z043 Encounter for examination and observation following other accident: Secondary | ICD-10-CM | POA: Diagnosis not present

## 2013-09-17 DIAGNOSIS — Z950 Presence of cardiac pacemaker: Secondary | ICD-10-CM | POA: Insufficient documentation

## 2013-09-17 DIAGNOSIS — W19XXXA Unspecified fall, initial encounter: Secondary | ICD-10-CM | POA: Diagnosis not present

## 2013-09-17 DIAGNOSIS — Z79899 Other long term (current) drug therapy: Secondary | ICD-10-CM | POA: Diagnosis not present

## 2013-09-17 DIAGNOSIS — I10 Essential (primary) hypertension: Secondary | ICD-10-CM | POA: Diagnosis not present

## 2013-09-17 NOTE — ED Notes (Signed)
Pt not complaining of any pain at the moment, still not talking to staff but does look at staff.

## 2013-09-17 NOTE — ED Provider Notes (Signed)
CSN: [id number]     Arrival date & time 09/17/13  1632 History   First MD Initiated Contact with Patient 09/17/13 1718     Chief Complaint  Patient presents with  . Fall     (Consider location/radiation/quality/duration/timing/severity/associated sxs/prior Treatment) HPI 80 year old demented female nonverbal at baseline unwitnessed possible fall questionable transient right knee pain now moving all 4 extremities well and appears back to baseline with no external evidence of trauma noted prior to arrival or in the ED. Patient wheelchair-bound at baseline.  Patient awake alert nonverbal looks around the room well moves all 4 extremities well cervical spine nontender back nontender abdomen nontender arms and legs nontender lungs clear    Past Medical History  Diagnosis Date  . Vascular dementia   . Esophageal reflux   . DM (diabetes mellitus) type II controlled, neurological manifestation   . Hypertension   . Pacemaker   . Cerebrovascular disease   . Osteoarthrosis, generalized, involving multiple sites    History reviewed. No pertinent past surgical history. No family history on file. History  Substance Use Topics  . Smoking status: Never Smoker   . Smokeless tobacco: Not on file  . Alcohol Use: No   OB History   Grav Para Term Preterm Abortions TAB SAB Ect Mult Living                 Review of Systems  Unable to perform ROS: Dementia      Allergies  Review of patient's allergies indicates no known allergies.  Home Medications   Prior to Admission medications   Medication Sig Start Date End Date Taking? Authorizing Provider  acetaminophen (TYLENOL) 325 MG tablet Take 650 mg by mouth every 6 (six) hours as needed for mild pain or fever.   Yes Historical Provider, MD  alum & mag hydroxide-simeth (MAALOX/MYLANTA) 200-200-20 MG/5ML suspension Take 30 mLs by mouth every 6 (six) hours as needed for indigestion or heartburn.   Yes Historical Provider, MD  aspirin 81 MG  chewable tablet Chew by mouth daily.   Yes Historical Provider, MD  Calcium Carbonate-Vitamin D (CALTRATE 600+D) 600-400 MG-UNIT per tablet Take 1 tablet by mouth 2 (two) times daily.   Yes Historical Provider, MD  divalproex (DEPAKOTE SPRINKLE) 125 MG capsule Take 250 mg by mouth 2 (two) times daily.   Yes Historical Provider, MD  glipiZIDE (GLUCOTROL) 10 MG tablet Take 10 mg by mouth 2 (two) times daily before a meal.    Yes Historical Provider, MD  guaifenesin (ROBITUSSIN) 100 MG/5ML syrup Take 200 mg by mouth 3 (three) times daily as needed for cough.   Yes Historical Provider, MD  insulin aspart (NOVOLOG) 100 UNIT/ML injection Inject 5 Units into the skin 3 (three) times daily after meals. If cbg >150   Yes Historical Provider, MD  insulin glargine (LANTUS) 100 UNIT/ML injection Inject 14 Units into the skin at bedtime.    Yes Historical Provider, MD  loperamide (IMODIUM) 2 MG capsule Take 2 mg by mouth as needed for diarrhea or loose stools.   Yes Historical Provider, MD  loratadine (CLARITIN) 10 MG tablet Take 10 mg by mouth daily.   Yes Historical Provider, MD  Memantine HCl ER (NAMENDA XR) 28 MG CP24 Take 28 mg by mouth daily.   Yes Historical Provider, MD  mirtazapine (REMERON) 7.5 MG tablet Take 7.5 mg by mouth at bedtime.   Yes Historical Provider, MD  neomycin-bacitracin-polymyxin (NEOSPORIN) ointment Apply 1 application topically as needed for wound care. apply  to eye   Yes Historical Provider, MD  ondansetron (ZOFRAN ODT) 8 MG disintegrating tablet Take 1 tablet (8 mg total) by mouth every 8 (eight) hours as needed for nausea or vomiting. 12/03/12  Yes Olivia Mackie, MD  ranitidine (ZANTAC) 150 MG tablet Take 150 mg by mouth 2 (two) times daily.   Yes Historical Provider, MD  Vitamin D, Ergocalciferol, (DRISDOL) 50000 UNITS CAPS capsule Take 50,000 Units by mouth every 7 (seven) days.   Yes Historical Provider, MD   BP 138/96  Pulse 57  Temp(Src) 99.6 F (37.6 C) (Oral)  Resp 14   SpO2 86% Physical Exam  Nursing note and vitals reviewed. Constitutional:  Awake, alert, nontoxic appearance.  HENT:  Head: Atraumatic.  Eyes: Right eye exhibits no discharge. Left eye exhibits no discharge.  Neck: Neck supple.  Cardiovascular: Normal rate and regular rhythm.   No murmur heard. Pulmonary/Chest: Effort normal and breath sounds normal. No respiratory distress. She has no wheezes. She has no rales. She exhibits no tenderness.  Do not feel 86% pulse ox accurate reading due to Pt uncooperative with monitor  Abdominal: Soft. Bowel sounds are normal. She exhibits no distension. There is no tenderness. There is no rebound and no guarding.  Musculoskeletal: She exhibits no tenderness.  Baseline ROM, no obvious new focal weakness.Patient awake alert nonverbal looks around the room well moves all 4 extremities well cervical spine nontender back nontender abdomen nontender arms and legs nontender lungs clear   Neurological: She is alert.  Mental status and motor strength appears baseline for patient and situation.Patient awake alert nonverbal looks around the room well moves all 4 extremities well cervical spine nontender back nontender abdomen nontender arms and legs nontender lungs clear   Skin: No rash noted.  Psychiatric: She has a normal mood and affect.    ED Course  Procedures (including critical care time) Patient does not follow commands and does not lie still; do not feel emergent imaging mandatory at this time. Labs Review Labs Reviewed - No data to display  Imaging Review No results found.   EKG Interpretation None      MDM   Final diagnoses:  Fall, initial encounter  Dementia, with behavioral disturbance   I doubt any other EMC precluding discharge at this time including, but not necessarily limited to the following:CSI.     Hurman Horn, MD 09/30/13 [phone]

## 2013-09-17 NOTE — Discharge Instructions (Signed)
Fall Prevention and Home Safety Falls cause injuries and can affect all age groups. It is possible to prevent falls.  HOW TO PREVENT FALLS  Wear shoes with rubber soles that do not have an opening for your toes.  Keep the inside and outside of your house well lit.  Use night lights throughout your home.  Remove clutter from floors.  Clean up floor spills.  Remove throw rugs or fasten them to the floor with carpet tape.  Do not place electrical cords across pathways.  Put grab bars by your tub, shower, and toilet. Do not use towel bars as grab bars.  Put handrails on both sides of the stairway. Fix loose handrails.  Do not climb on stools or stepladders, if possible.  Do not wax your floors.  Repair uneven or unsafe sidewalks, walkways, or stairs.  Keep items you use a lot within reach.  Be aware of pets.  Keep emergency numbers next to the telephone.  Put smoke detectors in your home and near bedrooms. Ask your doctor what other things you can do to prevent falls. Document Released: 10/14/2008 Document Revised: 06/19/2011 Document Reviewed: 03/20/2011 [facility] Patient Information 2015 [place], [place]. This information is not intended to replace advice given to you by your health care provider. Make sure you discuss any questions you have with your health care provider.  Dementia Dementia is a word that is used to describe problems with the brain and how it works. People with dementia have memory loss. They may also have problems with thinking, speaking, or solving problems. It can affect how they act around people, how they do their job, their mood, and their personality. These changes may not show up for a long time. Family or friends may not notice problems in the early part of this disease. HOME CARE The following tips are for the person living with, or caring for, the person with dementia. Make the home safe.  Remove locks on bathroom doors.  Use childproof locks on  cabinets where alcohol, cleaning supplies, or chemicals are stored.  Put outlet covers in electrical outlets.  Put in childproof locks to keep doors and windows safe.  Remove stove knobs, or put in safety knobs that shut off on their own.  Lower the temperature on water heaters.  Label medicines. Lock them in a safe place.  Keep knives, lighters, matches, power tools, and guns out of reach or in a safe place.  Remove objects that might break or can hurt the person.  Make sure lighting is good inside and outside.  Put in grab bars if needed.  Use a device that detects falls or other needs for help. Lessen confusion.  Keep familiar objects and people around.  Use night lights or low lit (dim) lights at night.  Label objects or areas.  Use reminders, notes, or directions for daily activities or tasks.  Keep a simple routine that is the same for waking, meals, bathing, dressing, and bedtime.  Create a calm and quiet home.  Put up clocks and calendars.  Keep emergency numbers and the home address near all phones.  Help show the different times of day. Open the curtains during the day to let light in. Speak clearly and directly.  Choose simple words and short sentences.  Use a gentle, calm voice.  Do not interrupt.  If the person has a hard time finding a word to use, give them the word or thought.  Ask 1 question at a time. Give  enough time for the person to answer. Repeat the question if the person does not answer. Do things that lessen restlessness.  Provide a comfortable bed.  Have the same bedtime routine every night.  Have a regular walking and activity schedule.  Lessen naps during the day.  Do not let the person drink a lot of caffeine.  Go to events that are not overwhelming. Eat well and drink fluids.  Lessen distractions during meal times and snacks.  Avoid foods that are too hot or too cold.  Watch how the person chews and swallows. This is  to make sure they do not choke. Other  Keep all vision, hearing, dental, and medical visits with the doctor.  Only give medicines as told by the doctor.  Watch the person's driving ability. Do not let the person drive if he or she cannot drive safely.  Use a program that helps find a person if they become missing. You may need to register with this program. GET HELP RIGHT AWAY IF:   A fever of 102 F (38.9 C) develops.  Confusion develops or gets worse.  Sleepiness develops or gets worse.  Staying awake is hard to do.  New behavior problems start like mood swings, aggression, and seeing things that are not there.  Problems with balance, speech, or falling develop.  Problems swallowing develop.  Any problems of another sickness develop. MAKE SURE YOU:  Understand these instructions.  Will watch his or her condition.  Will get help right away if he or she is not doing well or gets worse. Document Released: 12/01/2007 Document Revised: 03/12/2011 Document Reviewed: 05/15/2010 [facility] Patient Information 2015 [place], [place]. This information is not intended to replace advice given to you by your health care provider. Make sure you discuss any questions you have with your health care provider.  You might have had a head injury which does not appear to require admission at this time. A concussion is a state of changed mental ability from trauma. SEEK IMMEDIATE MEDICAL ATTENTION IF: There is confusion or drowsiness (although children frequently become drowsy after injury).  You cannot awaken the injured person.  There is nausea (feeling sick to your stomach) or continued, forceful vomiting.  You notice dizziness or unsteadiness which is getting worse, or inability to walk.  You have convulsions or unconsciousness.  You experience severe, persistent headaches not relieved by Tylenol?. (Do not take aspirin as this impairs clotting abilities). Take other pain medications only as  directed.  You cannot use arms or legs normally.  There are changes in pupil sizes. (This is the black center in the colored part of the eye)  There is clear or bloody discharge from the nose or ears.  Change in speech, vision, swallowing, or understanding.  Localized weakness, numbness, tingling, or change in bowel or bladder control.

## 2013-09-17 NOTE — ED Notes (Signed)
Bed: WU98 Expected date:  Expected time:  Means of arrival:  Comments: FALL

## 2013-09-17 NOTE — ED Notes (Signed)
Pt not cooperating when trying to take vitals, pt fighting and pulling off equipment and pt will not talk to staff.

## 2013-09-17 NOTE — ED Notes (Addendum)
Per EMS: pt is wheelchair bound, had unwittnesed fall around 1545 today, c/o right knee pain. Pt is a DNR.

## 2013-09-17 NOTE — ED Notes (Signed)
Would not allow me to get a good pulse ox reading she kept removing her finger of would not stop moving her hand

## 2013-09-17 NOTE — Progress Notes (Signed)
CSW met with patient at bedside to complete this assessment.  Per nursing staff the patient will not speak to staff but will with family.  Patient's family is not present at this time.  CSW introduced self to the patient and she acknowledge with eye contact.  Patient constantly rubbed her shin then would brush off the dead skin.  CSW assumed she was attempting to get rid of the ash on her leg therefore provided her with lotion.  CSW put a little on her and the patient rubbed in the skin until the ash was unseen.  Patient rolled her pants legs but would later roll them back up to rub leg again.  Patient do not appear in any pain or severe distress and according to medical staff will be discharged back to the facility.  NO further CSW follow up is needed at this time.    Chesley Noon, MSW, [facility], 09/17/2013 Evening Clinical Social Worker [phone]

## 2013-12-13 ENCOUNTER — Encounter: Payer: Self-pay | Admitting: Internal Medicine

## 2014-11-20 IMAGING — CR DG CHEST 1V PORT
1 series · 1 of 1 positions shown · non-contrast
Comparison: 04/26/2011

CLINICAL DATA: Cough and vomiting.

EXAM:
PORTABLE CHEST - 1 VIEW

[AP]
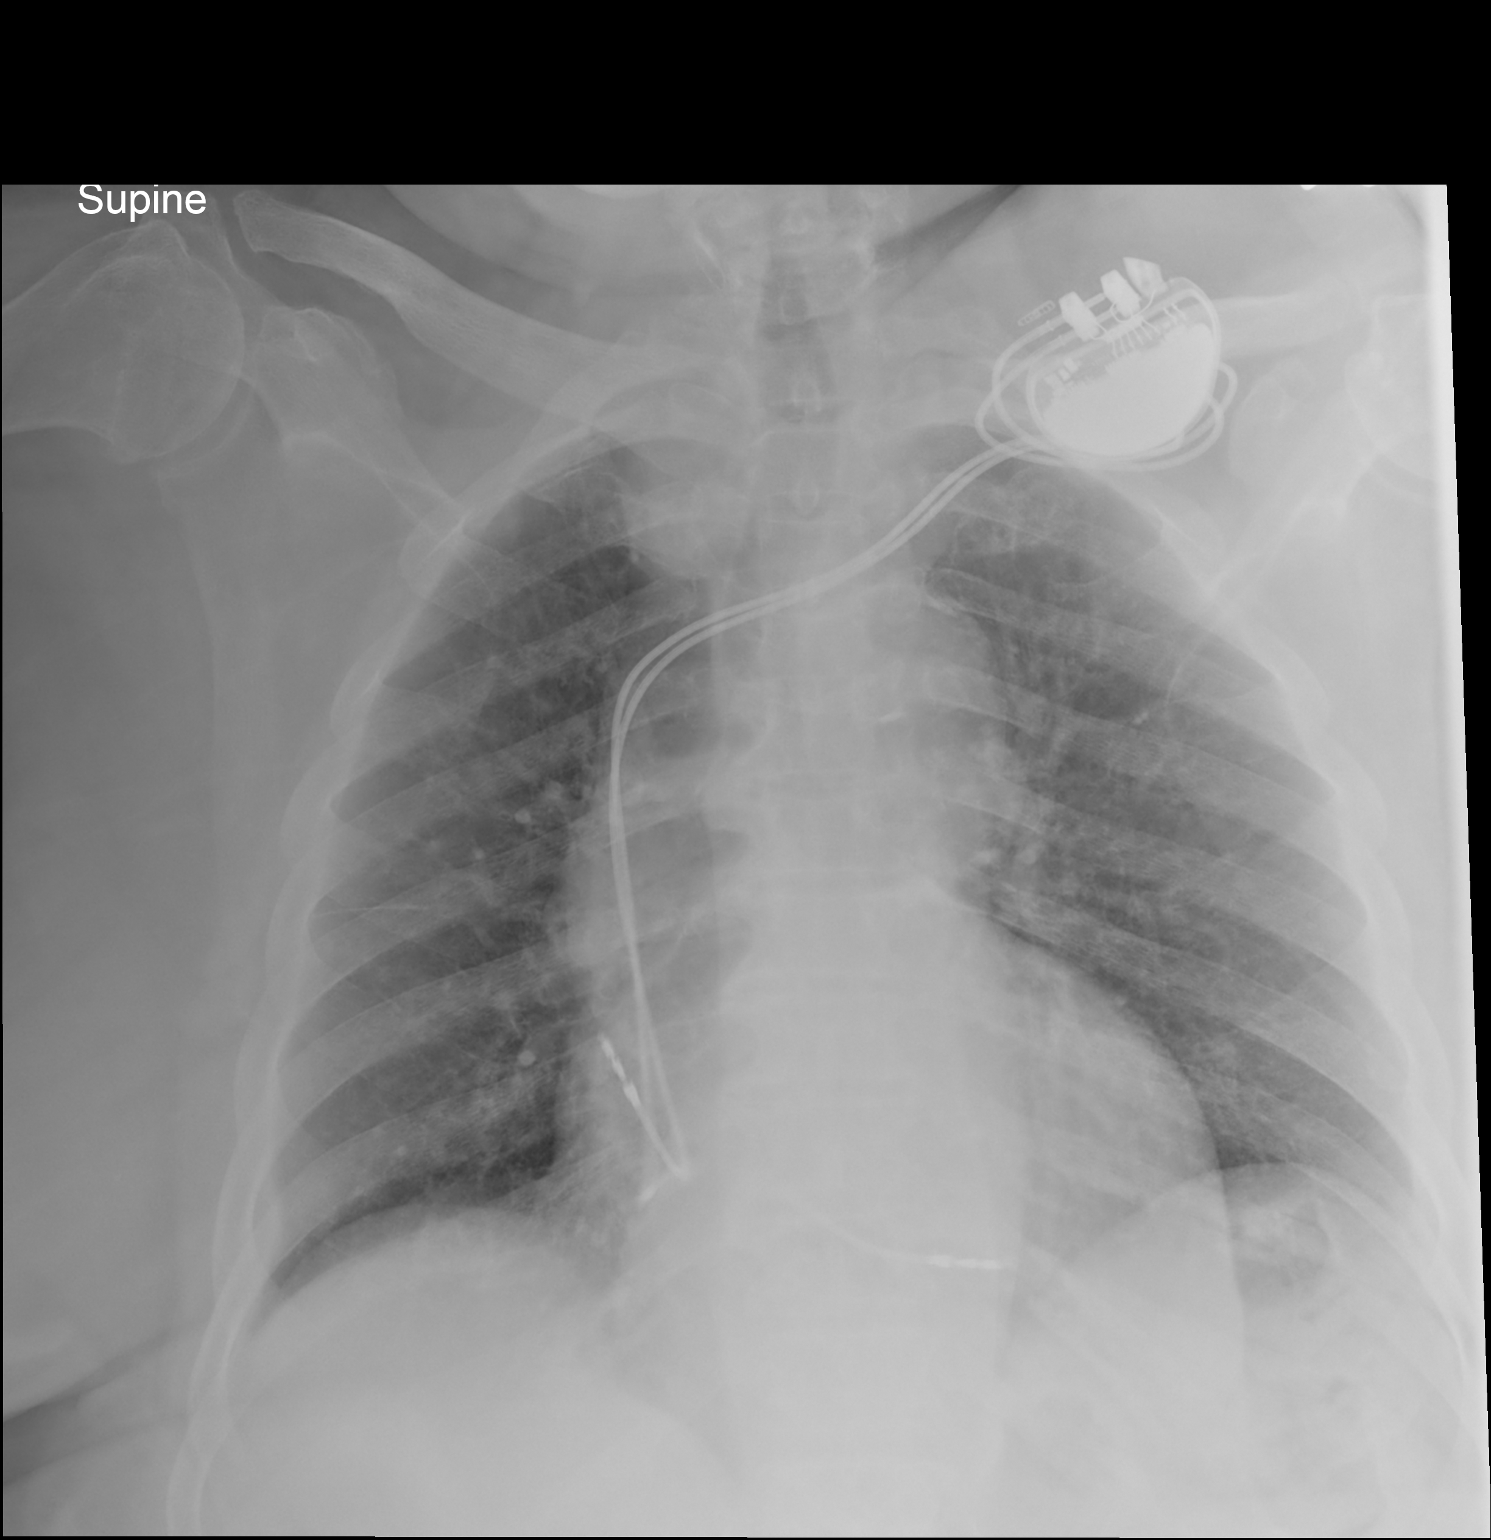

[1 of 1 positions shown; findings below may reference images not displayed]

FINDINGS: Pacer with leads at right atrium and right ventricle. No lead
discontinuity. Minimal motion degradation. Apical lordotic
positioning. Midline trachea. Moderate cardiomegaly. No pleural
effusion or pneumothorax. No congestive failure. Clear lungs. No
free intraperitoneal air.
IMPRESSION: Cardiomegaly without congestive failure.

## 2017-03-01 DEATH — deceased
